# Patient Record
Sex: Male | Born: 1960 | Race: White | Hispanic: No | Marital: Married | Smoking: Never smoker
Health system: Southern US, Community
[De-identification: ages and names within clinical notes are randomized; demographics above are authoritative.]

## PROBLEM LIST (undated history)

## (undated) DIAGNOSIS — Z8249 Family history of ischemic heart disease and other diseases of the circulatory system: Secondary | ICD-10-CM

## (undated) DIAGNOSIS — Z8279 Family history of other congenital malformations, deformations and chromosomal abnormalities: Secondary | ICD-10-CM

## (undated) DIAGNOSIS — F329 Major depressive disorder, single episode, unspecified: Secondary | ICD-10-CM

## (undated) HISTORY — PX: OTHER SURGICAL HISTORY: SHX169

## (undated) HISTORY — DX: Family history of ischemic heart disease and other diseases of the circulatory system: Z82.49

## (undated) HISTORY — DX: Major depressive disorder, single episode, unspecified: F32.9

## (undated) HISTORY — DX: Family history of other congenital malformations, deformations and chromosomal abnormalities: Z82.79

---
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
## 2004-12-18 ENCOUNTER — Ambulatory Visit: Payer: Self-pay | Admitting: Internal Medicine

## 2004-12-19 ENCOUNTER — Ambulatory Visit: Payer: Self-pay | Admitting: Internal Medicine

## 2006-06-01 ENCOUNTER — Ambulatory Visit: Payer: Self-pay | Admitting: Internal Medicine

## 2006-08-12 ENCOUNTER — Ambulatory Visit: Payer: Self-pay | Admitting: Internal Medicine

## 2007-09-06 ENCOUNTER — Encounter: Admission: RE | Admit: 2007-09-06 | Discharge: 2007-09-06 | Payer: Self-pay | Admitting: Orthopedic Surgery

## 2010-11-14 ENCOUNTER — Ambulatory Visit: Payer: Self-pay | Admitting: Internal Medicine

## 2010-11-14 LAB — CONVERTED CEMR LAB
ALT: 30 units/L (ref 0–53)
AST: 20 units/L (ref 0–37)
Albumin: 4.1 g/dL (ref 3.5–5.2)
Alkaline Phosphatase: 52 units/L (ref 39–117)
Basophils Relative: 0.3 % (ref 0.0–3.0)
Bilirubin, Direct: 0.1 mg/dL (ref 0.0–0.3)
CO2: 28 meq/L (ref 19–32)
Calcium: 9 mg/dL (ref 8.4–10.5)
Chloride: 105 meq/L (ref 96–112)
Eosinophils Relative: 2 % (ref 0.0–5.0)
Glucose, Bld: 80 mg/dL (ref 70–99)
HCT: 41.8 % (ref 39.0–52.0)
LDL Cholesterol: 92 mg/dL (ref 0–99)
Lymphs Abs: 1.2 10*3/uL (ref 0.7–4.0)
MCHC: 34.7 g/dL (ref 30.0–36.0)
MCV: 91.3 fL (ref 78.0–100.0)
Monocytes Relative: 6.8 % (ref 3.0–12.0)
Nitrite: NEGATIVE
Platelets: 168 10*3/uL (ref 150.0–400.0)
Protein, U semiquant: NEGATIVE
RBC: 4.58 M/uL (ref 4.22–5.81)
Sodium: 141 meq/L (ref 135–145)
Specific Gravity, Urine: 1.01
Total CHOL/HDL Ratio: 3
VLDL: 5.8 mg/dL (ref 0.0–40.0)
pH: 6.5

## 2010-11-24 DIAGNOSIS — F32A Depression, unspecified: Secondary | ICD-10-CM

## 2010-11-24 DIAGNOSIS — F329 Major depressive disorder, single episode, unspecified: Secondary | ICD-10-CM | POA: Insufficient documentation

## 2010-11-24 HISTORY — DX: Depression, unspecified: F32.A

## 2010-11-29 ENCOUNTER — Encounter: Payer: Self-pay | Admitting: Internal Medicine

## 2010-11-29 ENCOUNTER — Ambulatory Visit
Admission: RE | Admit: 2010-11-29 | Discharge: 2010-11-29 | Payer: Self-pay | Source: Home / Self Care | Attending: Internal Medicine | Admitting: Internal Medicine

## 2010-11-29 DIAGNOSIS — F329 Major depressive disorder, single episode, unspecified: Secondary | ICD-10-CM | POA: Insufficient documentation

## 2010-12-26 NOTE — Assessment & Plan Note (Signed)
Summary: cpx/njr   Vital Signs:  Patient profile:   50 year old male Height:      69.5 inches Weight:      181 pounds BMI:     26.44 Temp:     98.5 degrees F oral Pulse rate:   68 / minute Pulse rhythm:   regular BP sitting:   120 / 82  (left arm) Cuff size:   regular  Vitals Entered By: Alfred Levins, CMA (November 29, 2010 10:26 AM)  Nutrition Counseling: Patient's BMI is greater than 25 and therefore counseled on weight management options. CC: cpx   CC:  cpx.  History of Present Illness: CPX  new problem: mood swings: he admits to depression, agitation, anger has trouble with early and frequent awakenings ("i think about everything").  no change in work, family..."just my mood" All other systems reviewed and were negative except says 6 months ago "pulled groin on left side"---still with some discomfort---mostly noticeable at night---no daytie symptoms  Current Medications (verified): 1)  None  Allergies (verified): No Known Drug Allergies  Past History:  Family History: Last updated: 11/29/2010 Family History of Arthritis Paternal Grandmother and father Family History High cholesterol Father Family History Hypertension Father and mother (both alive) 3 brothers healthy  Social History: Last updated: 11/29/2010 Occupation: Married Never Smoked Alcohol use-yes once weekly Drug use-no Regular exercise-no  Risk Factors: Exercise: no (07/20/2007)  Risk Factors: Smoking Status: never (07/20/2007)  Past Medical History:  Depression---new dx 2012  Past Surgical History: Rt Hand Surgery  Family History: Family History of Arthritis Paternal Grandmother and father Family History High cholesterol Father Family History Hypertension Father and mother (both alive) 3 brothers healthy  Social History: Occupation: Married Never Smoked Alcohol use-yes once weekly Drug use-no Regular exercise-no  Physical Exam  General:   healthy-appearing male in no  acute distress. HEENT exam atraumatic, normocephalic, Dr. muscles are intact. Neck is supple without lymphadenopathy or thyromegaly. No jugular venous distention. Chest clear to auscultation without increased work of breathing. Abdomen percussion. Cardiac exam S1-S2 are regular abdominal cyanosis, soft and nontender extremities with no clubbing cyanosis or edema. he has full range of motion of both hips. Affect normal---perhaps decreased eye contact Ears:  R ear normal.     Impression & Recommendations:  Problem # 1:  PREVENTIVE HEALTH CARE (ICD-V70.0) discussed moderate weight loss  Problem # 2:  DEPRESSION (ICD-311)  discussed at length will try medications. Side effects discussed. I suspect his depression is affecting his sleep.  His updated medication list for this problem includes:    Sertraline Hcl 50 Mg Tabs (Sertraline hcl) .Marland Kitchen... 1 by mouth daily  Complete Medication List: 1)  Sertraline Hcl 50 Mg Tabs (Sertraline hcl) .Marland Kitchen.. 1 by mouth daily  Patient Instructions: 1)  see me 6 weeks Prescriptions: SERTRALINE HCL 50 MG TABS (SERTRALINE HCL) 1 by mouth daily  #90 x 3   Entered and Authorized by:   Birdie Sons MD   Signed by:   Birdie Sons MD on 11/29/2010   Method used:   Electronically to        [facility] [phone][facility] (retail)       [address] [place], [place]  [address]       Ph: [phone] or [phone]       Fax: [phone]   RxID:   [id number]    Orders Added: 1)  New  Patient 40-64 years [99386] 2)  Est. Patient Level II [16109]   Immunization History:  Tetanus/Td Immunization History:    Tetanus/Td:  historical (11/24/2004)    Immunization History:  Tetanus/Td Immunization History:    Tetanus/Td:  Historical (11/24/2004)

## 2011-01-02 ENCOUNTER — Encounter: Payer: Self-pay | Admitting: Internal Medicine

## 2011-01-03 ENCOUNTER — Ambulatory Visit (INDEPENDENT_AMBULATORY_CARE_PROVIDER_SITE_OTHER): Payer: BC Managed Care – PPO | Admitting: Internal Medicine

## 2011-01-03 ENCOUNTER — Encounter: Payer: Self-pay | Admitting: Internal Medicine

## 2011-01-03 DIAGNOSIS — F329 Major depressive disorder, single episode, unspecified: Secondary | ICD-10-CM

## 2011-01-03 NOTE — Assessment & Plan Note (Addendum)
Feels much better Wife states that he is much better.   15 minute discussion all counselling.  ADVISE CONTINUATION OF THE SERTRALINE. I'LL SEE HIM BACK IN 6 MONTHS.

## 2011-01-03 NOTE — Progress Notes (Signed)
  Subjective:    Patient ID: Tony Buck, male    DOB: Apr 27, 1961, 50 y.o.   MRN: [id number]  HPI   depression. See assessment and plan.  Review of Systems     Objective:   Physical Exam        Assessment & Plan:

## 2011-07-02 ENCOUNTER — Ambulatory Visit (INDEPENDENT_AMBULATORY_CARE_PROVIDER_SITE_OTHER): Payer: BC Managed Care – PPO | Admitting: Internal Medicine

## 2011-07-02 ENCOUNTER — Encounter: Payer: Self-pay | Admitting: Internal Medicine

## 2011-07-02 VITALS — BP 116/80 | HR 64 | Temp 98.3°F | Ht 70.0 in | Wt 179.0 lb

## 2011-07-02 DIAGNOSIS — F329 Major depressive disorder, single episode, unspecified: Secondary | ICD-10-CM

## 2011-07-02 MED ORDER — SERTRALINE HCL 50 MG PO TABS: 50.0000 mg | ORAL_TABLET | Freq: Every day | ORAL | Status: DC

## 2011-07-02 NOTE — Progress Notes (Signed)
  Subjective:    Patient ID: Tony Buck, male    DOB: 1961-03-25, 50 y.o.   MRN: [id number]  HPI Mood disorder- he continues to do well No significant side effects Wife states that medication is working.  Past Medical History  Diagnosis Date  . Depression 2012   Past Surgical History  Procedure Date  . Hand surgery 1975  . Hand     Right hand surgery    reports that he has never smoked. He does not have any smokeless tobacco history on file. He reports that he drinks alcohol. He reports that he does not use illicit drugs. family history includes Arthritis in his father and paternal grandmother; Hyperlipidemia in his father; and Hypertension in his father and mother. No Known Allergies    Review of Systems  patient denies chest pain, shortness of breath, orthopnea. Denies lower extremity edema, abdominal pain, change in appetite, change in bowel movements. Patient denies rashes, musculoskeletal complaints. No other specific complaints in a complete review of systems.      Objective:   Physical Exam   well-developed well-nourished male in no acute distress. HEENT exam atraumatic, normocephalic, neck supple without jugular venous distention. affect normal   Assessment & Plan:

## 2011-07-02 NOTE — Assessment & Plan Note (Signed)
sxs seem to  Be controlled Sleep is improved, mood is improved Continue meds

## 2011-07-09 ENCOUNTER — Telehealth: Payer: Self-pay | Admitting: Internal Medicine

## 2011-07-09 NOTE — Telephone Encounter (Signed)
Pt. Notified.

## 2011-07-09 NOTE — Telephone Encounter (Signed)
Have him decrease to 1/2 pill daily for 7 days and then 1/2 pill every other day for 7 days and then stop

## 2011-07-09 NOTE — Telephone Encounter (Signed)
Patient wants to wean off Zoloft. Please advise. Thanks.

## 2011-11-06 ENCOUNTER — Ambulatory Visit (INDEPENDENT_AMBULATORY_CARE_PROVIDER_SITE_OTHER): Payer: BC Managed Care – PPO | Admitting: Internal Medicine

## 2011-11-06 ENCOUNTER — Encounter: Payer: Self-pay | Admitting: Internal Medicine

## 2011-11-06 VITALS — BP 112/84 | HR 76 | Temp 98.2°F | Ht 71.0 in | Wt 186.0 lb

## 2011-11-06 DIAGNOSIS — F39 Unspecified mood [affective] disorder: Secondary | ICD-10-CM

## 2011-11-06 NOTE — Progress Notes (Signed)
Patient ID: Tony Buck, male   DOB: 03/06/1961, 50 y.o.   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  Past Surgical History  Procedure Date  . Hand surgery 1975  . Hand     Right hand surgery    reports that he has never smoked. He does not have any smokeless tobacco history on file. He reports that he drinks alcohol. He reports that he does not use illicit drugs. family history includes Arthritis in his father and paternal grandmother; Hyperlipidemia in his father; and Hypertension in his father and mother. No Known Allergies  Exam: Normal affect, appears well.  A/P Mood: well controlled off meds. 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

## 2012-01-02 ENCOUNTER — Ambulatory Visit: Payer: BC Managed Care – PPO | Admitting: Internal Medicine

## 2012-08-11 ENCOUNTER — Ambulatory Visit (INDEPENDENT_AMBULATORY_CARE_PROVIDER_SITE_OTHER): Payer: BC Managed Care – PPO | Admitting: Family Medicine

## 2012-08-11 ENCOUNTER — Encounter: Payer: Self-pay | Admitting: Family Medicine

## 2012-08-11 ENCOUNTER — Telehealth: Payer: Self-pay | Admitting: Internal Medicine

## 2012-08-11 VITALS — BP 130/80 | Temp 98.0°F | Wt 184.0 lb

## 2012-08-11 DIAGNOSIS — H538 Other visual disturbances: Secondary | ICD-10-CM

## 2012-08-11 DIAGNOSIS — R079 Chest pain, unspecified: Secondary | ICD-10-CM

## 2012-08-11 DIAGNOSIS — R51 Headache: Secondary | ICD-10-CM

## 2012-08-11 NOTE — Progress Notes (Signed)
Subjective:    Patient ID: Tony Buck, male    DOB: 11-07-1961, 51 y.o.   MRN: [id number]  HPI  Patient presents with several different symptoms as follows:  6 week history of headache. Relatively low grade 4/10 severity and achy quality, bilateral occipital. Symptoms tend to be worse early in the morning and usually improved following aspirin. Recent increased job stress. Poor quality of sleep. Reducing caffeine use gradually.  Earlier today at work patient had episode about 30 minutes duration of bilateral blurred vision. This was not just peripheral vision but entire visual field. He had some vision but blurring. No worsening of headache during this episode. He did have some very transient vertigo. No recent hearing changes. No history of migraine. No diplopia. No dysarthria. No focal weakness. No ataxia. No nausea or vomiting. Denies recent appetite or weight changes. No cognitive changes.  Patient relates very mild chest pain at rest currently. No recent exertional symptoms. No left arm symptoms. No diaphoresis.  He has difficulty describing quality. No significant past history. Nonsmoker. No history of diabetes. No GERD symptoms.  Past Medical History  Diagnosis Date  . Depression 2012   Past Surgical History  Procedure Date  . Hand surgery 1975  . Hand     Right hand surgery    reports that he has never smoked. He does not have any smokeless tobacco history on file. He reports that he drinks alcohol. He reports that he does not use illicit drugs. family history includes Arthritis in his father and paternal grandmother; Hyperlipidemia in his father; and Hypertension in his father and mother. No Known Allergies    Review of Systems  Constitutional: Negative for fever, chills, appetite change and unexpected weight change.  Eyes: Positive for visual disturbance.  Respiratory: Negative for shortness of breath and wheezing.   Cardiovascular: Positive for chest pain. Negative  for palpitations and leg swelling.  Gastrointestinal: Negative for abdominal pain.  Skin: Negative for rash.  Neurological: Positive for dizziness and headaches. Negative for seizures, syncope and weakness.  Hematological: Negative for adenopathy.  Psychiatric/Behavioral: Negative for confusion.       Objective:   Physical Exam  Constitutional: He is oriented to person, place, and time. He appears well-developed and well-nourished.  HENT:  Head: Normocephalic and atraumatic.  Right Ear: External ear normal.  Left Ear: External ear normal.  Eyes: Pupils are equal, round, and reactive to light.  Neck: Neck supple. No thyromegaly present.       No carotid bruits  Cardiovascular: Normal rate and regular rhythm.   No murmur heard. Pulmonary/Chest: Effort normal and breath sounds normal. No respiratory distress. He has no wheezes. He has no rales.  Musculoskeletal: He exhibits no edema.  Lymphadenopathy:    He has no cervical adenopathy.  Neurological: He is alert and oriented to person, place, and time. He has normal reflexes. No cranial nerve deficit. Coordination normal.       Normal cerebellar function. No focal strength deficits. Gait normal. Babinski's downgoing bilaterally. Romberg normal.  Skin: No rash noted.  Psychiatric: He has a normal mood and affect. His behavior is normal.          Assessment & Plan:  #1 chronic occipital headaches. Question chronic tension type. Our concern though is that he had episode lasting 30 minutes earlier today of bilateral blurred vision and also new symptom of vertigo which was very transient which may or may not be related. No worrisome features such as appetite or weight  changes.. Bilateral visual symptoms do not suggest TIA.  Set up CT head to further evaluate #2 atypical chest pain. Symptoms relatively mild. Doubt related to #1 check EKG  EKG sinus bradycardia (52) with no acute changes.

## 2012-08-11 NOTE — Telephone Encounter (Signed)
Patient calling for an appointment.  Has had alot headaches in the past 4-6 weeks.  Has had some dizziness for the past few days.  Denies any trauma.  He has been taking ASA to relieve the pain.  Had worsening dizziness this am for a short period of time.  Strongly urged him to have someone drive him to the appt.   RN override to have him seen in 4 hours.  Scheduled for 11a today.

## 2012-08-11 NOTE — Patient Instructions (Addendum)
Follow up immediately for any recurrent blurred vision, worsening headache, or any focal weakness,or other neurologic symptoms.

## 2012-08-13 ENCOUNTER — Other Ambulatory Visit: Payer: BC Managed Care – PPO

## 2012-08-17 ENCOUNTER — Ambulatory Visit (INDEPENDENT_AMBULATORY_CARE_PROVIDER_SITE_OTHER)
Admission: RE | Admit: 2012-08-17 | Discharge: 2012-08-17 | Disposition: A | Discharge status: Home / Self Care | Payer: BC Managed Care – PPO | Source: Ambulatory Visit | Attending: Family Medicine | Admitting: Family Medicine

## 2012-08-17 DIAGNOSIS — H538 Other visual disturbances: Secondary | ICD-10-CM

## 2012-08-17 DIAGNOSIS — R51 Headache: Secondary | ICD-10-CM

## 2012-08-18 NOTE — Progress Notes (Signed)
Quick Note:  Pt informed on personally identified VM ______ 

## 2012-09-24 ENCOUNTER — Other Ambulatory Visit (INDEPENDENT_AMBULATORY_CARE_PROVIDER_SITE_OTHER): Payer: BC Managed Care – PPO

## 2012-09-24 DIAGNOSIS — Z0000 Encounter for general adult medical examination without abnormal findings: Secondary | ICD-10-CM

## 2012-09-24 LAB — CBC WITH DIFFERENTIAL/PLATELET
Basophils Relative: 0.6 % (ref 0.0–3.0)
Lymphocytes Relative: 33.4 % (ref 12.0–46.0)
Lymphs Abs: 1.6 10*3/uL (ref 0.7–4.0)
Monocytes Absolute: 0.4 10*3/uL (ref 0.1–1.0)
Neutrophils Relative %: 55.4 % (ref 43.0–77.0)
Platelets: 157 10*3/uL (ref 150.0–400.0)
RBC: 4.7 Mil/uL (ref 4.22–5.81)

## 2012-09-24 LAB — HEPATIC FUNCTION PANEL
Albumin: 4 g/dL (ref 3.5–5.2)
Alkaline Phosphatase: 54 U/L (ref 39–117)
Total Bilirubin: 0.6 mg/dL (ref 0.3–1.2)
Total Protein: 6.7 g/dL (ref 6.0–8.3)

## 2012-09-24 LAB — LIPID PANEL
HDL: 46.1 mg/dL (ref >39.00)
LDL Cholesterol: 97 mg/dL (ref 0–99)
VLDL: 8.2 mg/dL (ref 0.0–40.0)

## 2012-09-24 LAB — POCT URINALYSIS DIPSTICK
Ketones, UA: NEGATIVE
Leukocytes, UA: NEGATIVE
Protein, UA: NEGATIVE
Urobilinogen, UA: 0.2

## 2012-09-24 LAB — BASIC METABOLIC PANEL
BUN: 18 mg/dL (ref 6–23)
CO2: 26 mEq/L (ref 19–32)
Calcium: 9 mg/dL (ref 8.4–10.5)
Chloride: 104 mEq/L (ref 96–112)
Creatinine, Ser: 1 mg/dL (ref 0.4–1.5)
Glucose, Bld: 86 mg/dL (ref 70–99)
Potassium: 4.5 mEq/L (ref 3.5–5.1)

## 2012-09-24 LAB — TSH: TSH: 1.26 u[IU]/mL (ref 0.35–5.50)

## 2012-09-24 LAB — PSA: PSA: 0.63 ng/mL (ref 0.10–4.00)

## 2012-10-11 ENCOUNTER — Ambulatory Visit (INDEPENDENT_AMBULATORY_CARE_PROVIDER_SITE_OTHER): Payer: BC Managed Care – PPO | Admitting: Internal Medicine

## 2012-10-11 ENCOUNTER — Encounter: Payer: Self-pay | Admitting: Internal Medicine

## 2012-10-11 VITALS — BP 118/88 | HR 68 | Temp 97.9°F | Ht 71.0 in | Wt 186.0 lb

## 2012-10-11 DIAGNOSIS — Z23 Encounter for immunization: Secondary | ICD-10-CM

## 2012-10-11 DIAGNOSIS — Z0000 Encounter for general adult medical examination without abnormal findings: Secondary | ICD-10-CM

## 2012-10-11 NOTE — Progress Notes (Signed)
Patient ID: Tony Buck, male   DOB: July 25, 1961, 51 y.o.   MRN: [id number]  CPX  Past Medical History  Diagnosis Date  . Depression 2012    History   Social History  . Marital Status: Married    Spouse Name: N/A    Number of Children: N/A  . Years of Education: N/A   Occupational History  . Not on file.   Social History Main Topics  . Smoking status: Never Smoker   . Smokeless tobacco: Not on file  . Alcohol Use: Yes  . Drug Use: No  . Sexually Active:    Other Topics Concern  . Not on file   Social History Narrative  . No narrative on file    Past Surgical History  Procedure Date  . Hand surgery 1975  . Hand     Right hand surgery    Family History  Problem Relation Age of Onset  . Hypertension Mother   . Hyperlipidemia Father   . Hypertension Father   . Arthritis Father   . Arthritis Paternal Grandmother     No Known Allergies  No current outpatient prescriptions on file prior to visit.     patient denies chest pain, shortness of breath, orthopnea. Denies lower extremity edema, abdominal pain, change in appetite, change in bowel movements. Patient denies rashes, musculoskeletal complaints. No other specific complaints in a complete review of systems.   BP 118/88  Pulse 68  Temp 97.9 F (36.6 C) (Oral)  Ht 5\' 11"  (1.803 m)  Wt 186 lb (84.369 kg)  BMI 25.94 kg/m2  well-developed well-nourished male in no acute distress. HEENT exam atraumatic, normocephalic, neck supple without jugular venous distention. Chest clear to auscultation cardiac exam S1-S2 are regular. Abdominal exam overweight with bowel sounds, soft and nontender. Extremities no edema. Neurologic exam is alert with a normal gait.   A/P- well visit- health maint UTD.

## 2013-02-18 ENCOUNTER — Encounter: Payer: Self-pay | Admitting: Internal Medicine

## 2013-03-28 ENCOUNTER — Encounter: Payer: Self-pay | Admitting: Internal Medicine

## 2013-03-28 ENCOUNTER — Ambulatory Visit (AMBULATORY_SURGERY_CENTER): Payer: BC Managed Care – PPO | Admitting: *Deleted

## 2013-03-28 VITALS — Ht 71.0 in | Wt 186.0 lb

## 2013-03-28 DIAGNOSIS — Z1211 Encounter for screening for malignant neoplasm of colon: Secondary | ICD-10-CM

## 2013-03-28 MED ORDER — NA SULFATE-K SULFATE-MG SULF 17.5-3.13-1.6 GM/177ML PO SOLN: ORAL | Status: DC

## 2013-03-28 NOTE — Progress Notes (Signed)
No egg or soy allergy  Emmi information given and pt registered

## 2013-04-08 ENCOUNTER — Encounter: Payer: Self-pay | Admitting: Internal Medicine

## 2013-04-08 ENCOUNTER — Ambulatory Visit (AMBULATORY_SURGERY_CENTER): Payer: BC Managed Care – PPO | Admitting: Internal Medicine

## 2013-04-08 VITALS — BP 122/69 | HR 51 | Temp 96.9°F | Resp 29 | Ht 71.0 in | Wt 186.0 lb

## 2013-04-08 DIAGNOSIS — D126 Benign neoplasm of colon, unspecified: Secondary | ICD-10-CM

## 2013-04-08 DIAGNOSIS — K5730 Diverticulosis of large intestine without perforation or abscess without bleeding: Secondary | ICD-10-CM

## 2013-04-08 DIAGNOSIS — Z1211 Encounter for screening for malignant neoplasm of colon: Secondary | ICD-10-CM

## 2013-04-08 MED ORDER — SODIUM CHLORIDE 0.9 % IV SOLN: 500.0000 mL | INTRAVENOUS | Status: DC

## 2013-04-08 NOTE — Patient Instructions (Addendum)
YOU HAD AN ENDOSCOPIC PROCEDURE TODAY AT THE [facility]: Refer to the procedure report that was given to you for any specific questions about what was found during the examination.  If the procedure report does not answer your questions, please call your gastroenterologist to clarify.  If you requested that your care partner not be given the details of your procedure findings, then the procedure report has been included in a sealed envelope for you to review at your convenience later.  YOU SHOULD EXPECT: Some feelings of bloating in the abdomen. Passage of more gas than usual.  Walking can help get rid of the air that was put into your GI tract during the procedure and reduce the bloating. If you had a lower endoscopy (such as a colonoscopy or flexible sigmoidoscopy) you may notice spotting of blood in your stool or on the toilet paper. If you underwent a bowel prep for your procedure, then you may not have a normal bowel movement for a few days.  DIET: Your first meal following the procedure should be a light meal and then it is ok to progress to your normal diet.  A half-sandwich or bowl of soup is an example of a good first meal.  Heavy or fried foods are harder to digest and may make you feel nauseous or bloated.  Likewise meals heavy in dairy and vegetables can cause extra gas to form and this can also increase the bloating.  Drink plenty of fluids but you should avoid alcoholic beverages for 24 hours.  ACTIVITY: Your care partner should take you home directly after the procedure.  You should plan to take it easy, moving slowly for the rest of the day.  You can resume normal activity the day after the procedure however you should NOT DRIVE or use heavy machinery for 24 hours (because of the sedation medicines used during the test).    SYMPTOMS TO REPORT IMMEDIATELY: A gastroenterologist can be reached at any hour.  During normal business hours, 8:30 AM to 5:00 PM Monday through Friday,  call [phone].  After hours and on weekends, please call the GI answering service at [phone] who will take a message and have the physician on call contact you.   Following lower endoscopy (colonoscopy or flexible sigmoidoscopy):  Excessive amounts of blood in the stool  Significant tenderness or worsening of abdominal pains  Swelling of the abdomen that is new, acute  Fever of 100F or higher    FOLLOW UP: If any biopsies were taken you will be contacted by phone or by letter within the next 1-3 weeks.  Call your gastroenterologist if you have not heard about the biopsies in 3 weeks.  Our staff will call the home number listed on your records the next business day following your procedure to check on you and address any questions or concerns that you may have at that time regarding the information given to you following your procedure. This is a courtesy call and so if there is no answer at the home number and we have not heard from you through the emergency physician on call, we will assume that you have returned to your regular daily activities without incident.  SIGNATURES/CONFIDENTIALITY: You and/or your care partner have signed paperwork which will be entered into your electronic medical record.  These signatures attest to the fact that that the information above on your After Visit Summary has been reviewed and is understood.  Full responsibility of the confidentiality   of this discharge information lies with you and/or your care-partner.  Polyp and diverticulosis information given. 

## 2013-04-08 NOTE — Op Note (Signed)
[facility] [address] [place], [address]   COLONOSCOPY PROCEDURE REPORT  PATIENT: Tony Buck, Tony Buck  MR#: [id number] BIRTHDATE: 11/01/1961 , 51  yrs. old GENDER: Male ENDOSCOPIST: Iva Boop, MD, [facility] REFERRED WU:[id number] Swords, M.D. PROCEDURE DATE:  04/08/2013 PROCEDURE:   Colonoscopy, screening ASA CLASS:   Class I INDICATIONS:average risk screening. MEDICATIONS: propofol (Diprivan) 300mg  IV, MAC sedation, administered by CRNA, and These medications were titrated to patient response per physician's verbal order  DESCRIPTION OF PROCEDURE:   After the risks benefits and alternatives of the procedure were thoroughly explained, informed consent was obtained.  A digital rectal exam revealed no abnormalities of the rectum, A digital rectal exam revealed no prostatic nodules, and A digital rectal exam revealed the prostate was not enlarged.   The [id number]  endoscope was introduced through the anus and advanced to the cecum, which was identified by both the appendix and ileocecal valve. No adverse events experienced.   The quality of the prep was excellent using Suprep  The instrument was then slowly withdrawn as the colon was fully examined.      COLON FINDINGS: A sessile polyp measuring 5 mm in size was found in the transverse colon.  A polypectomy was performed with a cold snare.  The resection was complete and the polyp tissue was completely retrieved.   Moderate diverticulosis was noted The finding was in the left colon.   Mild diverticulosis was noted The finding was in the right colon.   The colon mucosa was otherwise normal.   A right colon retroflexion was performed.  Retroflexed views revealed no abnormalities. The time to cecum=3 minutes 01 seconds.  Withdrawal time=11 minutes 34 seconds.  The scope was withdrawn and the procedure completed. COMPLICATIONS: There were no complications.  ENDOSCOPIC IMPRESSION: 1.   Sessile polyp  measuring 5 mm in size was found in the transverse colon; polypectomy was performed with a cold snare 2.   Moderate diverticulosis was noted in the left colon 3.   Mild diverticulosis was noted in the right colon 4.   The colon mucosa was otherwise normal - excellent prep  RECOMMENDATIONS: Timing of repeat colonoscopy will be determined by pathology findings.   eSigned:  Iva Boop, MD, [facility] 04/08/2013 12:30 PM   cc: Lindley Magnus, MD and The Patient

## 2013-04-08 NOTE — Progress Notes (Signed)
Called to room to assist during endoscopic procedure.  Patient ID and intended procedure confirmed with present staff. Received instructions for my participation in the procedure from the performing physician.Called to room to assist during endoscopic procedure.  Patient ID and intended procedure confirmed with present staff. Received instructions for my participation in the procedure from the performing physician. 

## 2013-04-08 NOTE — Progress Notes (Signed)
Patient did not experience any of the following events: a burn prior to discharge; a fall within the facility; wrong site/side/patient/procedure/implant event; or a hospital transfer or hospital admission upon discharge from the facility. (G8907) Patient did not have preoperative order for IV antibiotic SSI prophylaxis. (G8918)  

## 2013-04-11 ENCOUNTER — Telehealth: Payer: Self-pay | Admitting: *Deleted

## 2013-04-11 NOTE — Telephone Encounter (Signed)
No answer, message left for the patient. 

## 2013-04-14 ENCOUNTER — Encounter: Payer: Self-pay | Admitting: Internal Medicine

## 2013-04-14 NOTE — Progress Notes (Signed)
Quick Note:  Diminutive hyperplastic polyp Repeat colon 10 yrs - 2024 ______

## 2015-02-05 ENCOUNTER — Encounter: Payer: Self-pay | Admitting: Family Medicine

## 2015-02-05 ENCOUNTER — Ambulatory Visit (INDEPENDENT_AMBULATORY_CARE_PROVIDER_SITE_OTHER): Payer: 59 | Admitting: Family Medicine

## 2015-02-05 VITALS — BP 130/90 | HR 70 | Temp 98.2°F | Ht 71.0 in | Wt 190.1 lb

## 2015-02-05 DIAGNOSIS — R030 Elevated blood-pressure reading, without diagnosis of hypertension: Secondary | ICD-10-CM

## 2015-02-05 DIAGNOSIS — E663 Overweight: Secondary | ICD-10-CM

## 2015-02-05 DIAGNOSIS — J019 Acute sinusitis, unspecified: Secondary | ICD-10-CM

## 2015-02-05 DIAGNOSIS — IMO0001 Reserved for inherently not codable concepts without codable children: Secondary | ICD-10-CM

## 2015-02-05 DIAGNOSIS — R631 Polydipsia: Secondary | ICD-10-CM | POA: Diagnosis not present

## 2015-02-05 LAB — LIPID PANEL
Cholesterol: 162 mg/dL (ref 0–200)
HDL: 55.1 mg/dL (ref >39.00)
LDL Cholesterol: 76 mg/dL (ref 0–99)
NONHDL: 106.9
TRIGLYCERIDES: 156 mg/dL — AB (ref 0.0–149.0)
Total CHOL/HDL Ratio: 3
VLDL: 31.2 mg/dL (ref 0.0–40.0)

## 2015-02-05 LAB — HEMOGLOBIN A1C: HEMOGLOBIN A1C: 5.3 % (ref 4.6–6.5)

## 2015-02-05 MED ORDER — AMOXICILLIN-POT CLAVULANATE 875-125 MG PO TABS: 1.0000 | ORAL_TABLET | Freq: Two times a day (BID) | ORAL | Status: DC

## 2015-02-05 NOTE — Progress Notes (Signed)
Pre visit review using our clinic review tool, if applicable. No additional management support is needed unless otherwise documented below in the visit note. 

## 2015-02-05 NOTE — Addendum Note (Signed)
Addended by: Lucretia Kern on: 02/05/2015 08:26 AM   Modules accepted: Orders

## 2015-02-05 NOTE — Patient Instructions (Signed)
BEFORE YOU LEAVE: -scheduled New Patient Visit -labs  -We have ordered labs or studies at this visit. It can take up to 1-2 weeks for results and processing. We will contact you with instructions IF your results are abnormal. Normal results will be released to your [facility]. If you have not heard from [place] or can not find your results in [facility] in 2 weeks please contact our office.  -Take the antibiotic as advised. See the Ear, Nose and Throat doctor if symptoms persist.  We recommend the following healthy lifestyle measures: - eat a healthy diet consisting of lots of vegetables, fruits, beans, nuts, seeds, healthy meats such as white chicken and fish and whole grains.  - avoid fried foods, fast food, processed foods, sodas, red meet and other fattening foods.  - get a least 150 minutes of aerobic exercise per week.

## 2015-02-05 NOTE — Progress Notes (Signed)
HPI:  Tony Buck is a 54 yo pior patient of Dr. Leanne Chang with no PCP currently, last seen here in 2013, here for an acute visit for:   "?sinus infection" -started 3-4 weeks ago -sinus congestion, PND, sensitive teeth, dry mouth sometimes - now with sinus pressure and pain in teeth -denies: fevers, chills, SOB, dysphagia, NVD -hx of sinus infections -wants his basic labs as has gained weight -hx of sinus infection    ROS: See pertinent positives and negatives per HPI.  Past Medical History  Diagnosis Date  . Depression 2012    Past Surgical History  Procedure Laterality Date  . Hand surgery  1975  . Hand      Right hand surgery    Family History  Problem Relation Age of Onset  . Hypertension Mother   . Hyperlipidemia Father   . Hypertension Father   . Arthritis Father   . Arthritis Paternal Grandmother     History   Social History  . Marital Status: Married    Spouse Name: N/A  . Number of Children: N/A  . Years of Education: N/A   Social History Main Topics  . Smoking status: Never Smoker   . Smokeless tobacco: Never Used  . Alcohol Use: Yes     Comment: 3 beers a week  . Drug Use: No  . Sexual Activity: Not on file   Other Topics Concern  . None   Social History Narrative    No current outpatient prescriptions on file.  EXAM:  Filed Vitals:   02/05/15 0804  BP: 130/90  Pulse: 70  Temp: 98.2 F (36.8 C)    Body mass index is 26.53 kg/(m^2).  GENERAL: vitals reviewed and listed above, alert, oriented, appears well hydrated and in no acute distress  HEENT: atraumatic, conjunttiva clear, no obvious abnormalities on inspection of external nose and ears, normal appearance of ear canals and TMs except for bilat effusion - purulent on R, thick nasal congestion, mild post oropharyngeal erythema with PND, no tonsillar edema or exudate, no sinus TTP  NECK: bilat upper ant cervical LAD  - mobile  LUNGS: clear to auscultation bilaterally, no  wheezes, rales or rhonchi, good air movement  CV: HRRR, no peripheral edema  MS: moves all extremities without noticeable abnormality  PSYCH: pleasant and cooperative, no obvious depression or anxiety  ASSESSMENT AND PLAN:  Discussed the following assessment and plan:  Acute sinusitis, recurrence not specified, unspecified location -tx with augmentin, risk discussed, advised ENT if LAD persists and number provided to call  Increased thirst - Plan: Hemoglobin A1c  Overweight - Plan: Lipid panel  Elevated BP -recheck at establish care visit  Advised to establish care with new PCP in next 2-3 month for follow up and new pt visit. Advised CPE not done at new pt visit.s  -Patient advised to return or notify a doctor immediately if symptoms worsen or persist or new concerns arise.  Patient Instructions  BEFORE YOU LEAVE: -scheduled New Patient Visit -labs  -We have ordered labs or studies at this visit. It can take up to 1-2 weeks for results and processing. We will contact you with instructions IF your results are abnormal. Normal results will be released to your [facility]. If you have not heard from [place] or can not find your results in [facility] in 2 weeks please contact our office.  -Take the antibiotic as advised. See the Ear, Nose and Throat doctor if symptoms persist.  We recommend the following healthy lifestyle  measures: - eat a healthy diet consisting of lots of vegetables, fruits, beans, nuts, seeds, healthy meats such as white chicken and fish and whole grains.  - avoid fried foods, fast food, processed foods, sodas, red meet and other fattening foods.  - get a least 150 minutes of aerobic exercise per week.            Colin Benton R.

## 2015-05-07 ENCOUNTER — Ambulatory Visit: Payer: 59 | Admitting: Family Medicine

## 2016-12-17 DIAGNOSIS — L570 Actinic keratosis: Secondary | ICD-10-CM | POA: Diagnosis not present

## 2017-03-31 DIAGNOSIS — L578 Other skin changes due to chronic exposure to nonionizing radiation: Secondary | ICD-10-CM | POA: Diagnosis not present

## 2017-03-31 DIAGNOSIS — D2230 Melanocytic nevi of unspecified part of face: Secondary | ICD-10-CM | POA: Diagnosis not present

## 2017-03-31 DIAGNOSIS — D225 Melanocytic nevi of trunk: Secondary | ICD-10-CM | POA: Diagnosis not present

## 2018-02-18 ENCOUNTER — Ambulatory Visit: Payer: 59 | Admitting: Family Medicine

## 2018-04-13 DIAGNOSIS — D235 Other benign neoplasm of skin of trunk: Secondary | ICD-10-CM | POA: Diagnosis not present

## 2018-04-13 DIAGNOSIS — L570 Actinic keratosis: Secondary | ICD-10-CM | POA: Diagnosis not present

## 2018-04-13 DIAGNOSIS — D2230 Melanocytic nevi of unspecified part of face: Secondary | ICD-10-CM | POA: Diagnosis not present

## 2018-04-13 DIAGNOSIS — D225 Melanocytic nevi of trunk: Secondary | ICD-10-CM | POA: Diagnosis not present

## 2018-04-26 ENCOUNTER — Encounter: Payer: Self-pay | Admitting: Family Medicine

## 2018-04-26 ENCOUNTER — Other Ambulatory Visit: Payer: Self-pay | Admitting: Family Medicine

## 2018-04-26 ENCOUNTER — Ambulatory Visit (INDEPENDENT_AMBULATORY_CARE_PROVIDER_SITE_OTHER): Payer: 59 | Admitting: Family Medicine

## 2018-04-26 VITALS — BP 104/62 | HR 62 | Temp 98.2°F | Ht 70.0 in | Wt 182.0 lb

## 2018-04-26 DIAGNOSIS — Z125 Encounter for screening for malignant neoplasm of prostate: Secondary | ICD-10-CM

## 2018-04-26 DIAGNOSIS — N138 Other obstructive and reflux uropathy: Secondary | ICD-10-CM

## 2018-04-26 DIAGNOSIS — Z0000 Encounter for general adult medical examination without abnormal findings: Secondary | ICD-10-CM

## 2018-04-26 DIAGNOSIS — Z23 Encounter for immunization: Secondary | ICD-10-CM

## 2018-04-26 DIAGNOSIS — N401 Enlarged prostate with lower urinary tract symptoms: Secondary | ICD-10-CM

## 2018-04-26 DIAGNOSIS — Z1159 Encounter for screening for other viral diseases: Secondary | ICD-10-CM

## 2018-04-26 DIAGNOSIS — Z8249 Family history of ischemic heart disease and other diseases of the circulatory system: Secondary | ICD-10-CM

## 2018-04-26 DIAGNOSIS — Z8279 Family history of other congenital malformations, deformations and chromosomal abnormalities: Secondary | ICD-10-CM

## 2018-04-26 DIAGNOSIS — Z8342 Family history of familial hypercholesterolemia: Secondary | ICD-10-CM

## 2018-04-26 DIAGNOSIS — Z7189 Other specified counseling: Secondary | ICD-10-CM | POA: Insufficient documentation

## 2018-04-26 LAB — COMPREHENSIVE METABOLIC PANEL
ALK PHOS: 59 U/L (ref 39–117)
ALT: 23 U/L (ref 0–53)
AST: 17 U/L (ref 0–37)
Albumin: 4.4 g/dL (ref 3.5–5.2)
BUN: 15 mg/dL (ref 6–23)
CALCIUM: 9.3 mg/dL (ref 8.4–10.5)
CO2: 27 mEq/L (ref 19–32)
Chloride: 105 mEq/L (ref 96–112)
Creatinine, Ser: 1.09 mg/dL (ref 0.40–1.50)
GFR: 74.38 mL/min (ref >60.00)
Glucose, Bld: 97 mg/dL (ref 70–99)
POTASSIUM: 4.5 meq/L (ref 3.5–5.1)
Sodium: 139 mEq/L (ref 135–145)
Total Bilirubin: 0.9 mg/dL (ref 0.2–1.2)
Total Protein: 6.8 g/dL (ref 6.0–8.3)

## 2018-04-26 LAB — LIPID PANEL
CHOLESTEROL: 160 mg/dL (ref 0–200)
HDL: 55.4 mg/dL (ref >39.00)
LDL Cholesterol: 88 mg/dL (ref 0–99)
NonHDL: 104.26
TRIGLYCERIDES: 79 mg/dL (ref 0.0–149.0)
Total CHOL/HDL Ratio: 3
VLDL: 15.8 mg/dL (ref 0.0–40.0)

## 2018-04-26 LAB — CBC WITH DIFFERENTIAL/PLATELET
Basophils Absolute: 0 10*3/uL (ref 0.0–0.1)
Basophils Relative: 0.6 % (ref 0.0–3.0)
EOS PCT: 1.5 % (ref 0.0–5.0)
Eosinophils Absolute: 0.1 10*3/uL (ref 0.0–0.7)
HEMATOCRIT: 44.7 % (ref 39.0–52.0)
Hemoglobin: 15.2 g/dL (ref 13.0–17.0)
LYMPHS ABS: 1.3 10*3/uL (ref 0.7–4.0)
LYMPHS PCT: 31.4 % (ref 12.0–46.0)
MCHC: 34.1 g/dL (ref 30.0–36.0)
MCV: 92.8 fl (ref 78.0–100.0)
MONOS PCT: 9.3 % (ref 3.0–12.0)
Monocytes Absolute: 0.4 10*3/uL (ref 0.1–1.0)
Neutro Abs: 2.4 10*3/uL (ref 1.4–7.7)
Neutrophils Relative %: 57.2 % (ref 43.0–77.0)
Platelets: 162 10*3/uL (ref 150.0–400.0)
RBC: 4.82 Mil/uL (ref 4.22–5.81)
RDW: 13.6 % (ref 11.5–15.5)
WBC: 4.3 10*3/uL (ref 4.0–10.5)

## 2018-04-26 LAB — PSA: PSA: 0.47 ng/mL (ref 0.10–4.00)

## 2018-04-26 NOTE — Patient Instructions (Signed)
Great to meet you.  I will call you with your lab results from today and you can view them online.   

## 2018-04-26 NOTE — Progress Notes (Signed)
Subjective:   Patient ID: Tony Buck, male    DOB: 04-10-1961, 57 y.o.   MRN: [id number]  Tony Buck is a pleasant 57 y.o. year old male who presents to clinic today with New Patient (Initial Visit) (Patient is here today to transfer care from Phoebe Sharps, MD to Arnette Norris, MD and establish.  He had black coffee only this am.  He agrees to get the Tdap today.)  on 04/26/2018  HPI:  Doing well.  Has not been to doctor (other than his dermatologist yearly) in several years.  No complaints today.  Does not take any rxs regularly.  Does have strong family h/o HLD.  He also has a family history of enlarged aorta in several family members- two brother and his father.  He is not sure which type but will find out for me- ? AAA Lab Results  Component Value Date   CHOL 162 02/05/2015   HDL 55.10 02/05/2015   LDLCALC 76 02/05/2015   TRIG 156.0 (H) 02/05/2015   CHOLHDL 3 02/05/2015      No current outpatient medications on file prior to visit.   No current facility-administered medications on file prior to visit.     No Known Allergies  Past Medical History:  Diagnosis Date  . Depression 2012    Past Surgical History:  Procedure Laterality Date  . hand     Right hand surgery  . HAND SURGERY  1975    Family History  Problem Relation Age of Onset  . Hypertension Mother   . Hyperlipidemia Father   . Hypertension Father   . Arthritis Father   . Arthritis Paternal Grandmother     Social History   Socioeconomic History  . Marital status: Married    Spouse name: Not on file  . Number of children: Not on file  . Years of education: Not on file  . Highest education level: Not on file  Occupational History  . Not on file  Social Needs  . Financial resource strain: Not on file  . Food insecurity:    Worry: Not on file    Inability: Not on file  . Transportation needs:    Medical: Not on file    Non-medical: Not on file  Tobacco Use  . Smoking status:  Never Smoker  . Smokeless tobacco: Never Used  Substance and Sexual Activity  . Alcohol use: Yes    Comment: 3 beers a week  . Drug use: No  . Sexual activity: Not on file  Lifestyle  . Physical activity:    Days per week: Not on file    Minutes per session: Not on file  . Stress: Not on file  Relationships  . Social connections:    Talks on phone: Not on file    Gets together: Not on file    Attends religious service: Not on file    Active member of club or organization: Not on file    Attends meetings of clubs or organizations: Not on file    Relationship status: Not on file  . Intimate partner violence:    Fear of current or ex partner: Not on file    Emotionally abused: Not on file    Physically abused: Not on file    Forced sexual activity: Not on file  Other Topics Concern  . Not on file  Social History Narrative  . Not on file   The PMH, PSH, Social History, Family History, Medications, and allergies have  been reviewed in [facility], and have been updated if relevant.  Review of Systems  Constitutional: Negative.   HENT: Negative.   Eyes: Negative.   Respiratory: Negative.   Cardiovascular: Negative.   Gastrointestinal: Negative.   Endocrine: Negative.   Genitourinary: Negative.   Musculoskeletal: Negative.   Skin: Negative.   Allergic/Immunologic: Negative.   Neurological: Negative.   Hematological: Negative.   Psychiatric/Behavioral: Negative.   All other systems reviewed and are negative.      Objective:    BP 104/62 (BP Location: Left Arm, Patient Position: Sitting, Cuff Size: Normal)   Pulse 62   Temp 98.2 F (36.8 C) (Oral)   Ht 5\' 10"  (1.778 m)   Wt 182 lb (82.6 kg)   SpO2 (!) 87%   BMI 26.11 kg/m    Physical Exam  General:  pleasant male in no acute distress Eyes:  PERRL Ears:  External ear exam shows no significant lesions or deformities.  TMs normal bilaterally Hearing is grossly normal bilaterally. Nose:  External nasal examination  shows no deformity or inflammation. Nasal mucosa are pink and moist without lesions or exudates. Mouth:  Oral mucosa and oropharynx without lesions or exudates.  Teeth in good repair. Neck:  no carotid bruit or thyromegaly no cervical or supraclavicular lymphadenopathy  Lungs:  Normal respiratory effort, chest expands symmetrically. Lungs are clear to auscultation, no crackles or wheezes. Heart:  Normal rate and regular rhythm. S1 and S2 normal without gallop, murmur, click, rub or other extra sounds. Abdomen:  Bowel sounds positive,abdomen soft and non-tender without masses, organomegaly or hernias noted. Pulses:  R and L posterior tibial pulses are full and equal bilaterally  Extremities:  no edema  Psych:  Good eye contact, not anxious or depressed appearing      Assessment & Plan:   Visit for well man health check  Need for Tdap vaccination - Plan: Tdap vaccine greater than or equal to 7yo IM  Screening for prostate cancer - Plan: PSA  Family history of high cholesterol - Plan: Comprehensive metabolic panel, Lipid panel  BPH with obstruction/lower urinary tract symptoms - Plan: CBC with Differential/Platelet, PSA No follow-ups on file.

## 2018-04-26 NOTE — Assessment & Plan Note (Signed)
Reviewed preventive care protocols, scheduled due services, and updated immunizations Discussed nutrition, exercise, diet, and healthy lifestyle.  Orders Placed This Encounter  Procedures  . Tdap vaccine greater than or equal to 56yo IM  . CBC with Differential/Platelet  . Comprehensive metabolic panel  . Lipid panel  . PSA  . Hepatitis C Antibody

## 2018-04-27 LAB — HEPATITIS C ANTIBODY
HEP C AB: NONREACTIVE
SIGNAL TO CUT-OFF: 0.02 (ref <1.00)

## 2018-04-29 DIAGNOSIS — Z8279 Family history of other congenital malformations, deformations and chromosomal abnormalities: Secondary | ICD-10-CM

## 2018-04-29 DIAGNOSIS — Z8249 Family history of ischemic heart disease and other diseases of the circulatory system: Secondary | ICD-10-CM | POA: Insufficient documentation

## 2018-04-29 HISTORY — DX: Family history of other congenital malformations, deformations and chromosomal abnormalities: Z82.79

## 2018-04-29 HISTORY — DX: Family history of ischemic heart disease and other diseases of the circulatory system: Z82.49

## 2018-04-29 NOTE — Progress Notes (Signed)
Cardiology Office Note:    Date:  05/04/2018   ID:  Tony Buck, DOB Mar 31, 1961, MRN [id number]  PCP:  Tony Passy, MD  Cardiologist:  Tony More, MD   Referring MD: Tony Passy, MD  ASSESSMENT:    1. Cardiac risk counseling   2. Family history of first degree relative with bicuspid aortic valve   3. Family history of aortic aneurysm   4. Thoracic aortic aneurysm without rupture ([place])    PLAN:    In order of problems listed above:  1. His family history is uncommon for a bicuspid aortic valve with approximately 10% of the first-degree relatives with the same diagnosis it is Buck suggestive of an  Aortopathy and for further evaluation I asked her to schedule CTA of his chest. 2. Echocardiogram ordered he has no physical findings of bicuspid valve 3. See discussion under #1 4. See discussion under #1  Next appointment as needed if the CTA and echocardiogram are normal.  If he has aortopathy genetic testing should be considered   Medication Adjustments/Labs and Tests Ordered: Current medicines are reviewed at length with the patient today.  Concerns regarding medicines are outlined above.  Orders Placed This Encounter  Procedures  . CT ANGIO CHEST AORTA W &/OR WO CONTRAST  . EKG 12-Lead  . ECHOCARDIOGRAM COMPLETE   No orders of the defined types were placed in this encounter.    Chief Complaint  Patient presents with  . New Patient (Initial Visit)    FH od valvular heart disease and aneurysm    History of Present Illness:    Tony Buck is a 57 y.o. male who is being seen today for the evaluation of aortic aneurysm and valvular heart disease at the request of Tony Passy, MD. He is concerned 2 brothers and father had a bicuspid aortic valve and some dilation of the thoracic aorta.  There is no family history of Marfan syndrome none of them required surgical intervention for valve disease or aneurysm although his father did have bypass surgery.  He has  had no lens dislocation or joint dislocation and no first-degree relative with sudden death in early age.  He is never had a heart murmur appreciated no chest pain shortness of breath palpitation or syncope and no history of congenital or rheumatic heart disease  Past Medical History:  Diagnosis Date  . Depression 2012  . Family history of aortic aneurysm 04/29/2018  . Family history of first degree relative with bicuspid aortic valve 04/29/2018    Past Surgical History:  Procedure Laterality Date  . hand     Right hand surgery  . HAND SURGERY  1975    Current Medications: No outpatient medications have been marked as taking for the 05/04/18 encounter (Office Visit) with Richardo Priest, MD.     Allergies:   Patient has no known allergies.   Social History   Socioeconomic History  . Marital status: Married    Spouse name: Not on file  . Number of children: Not on file  . Years of education: Not on file  . Highest education level: Not on file  Occupational History  . Not on file  Social Needs  . Financial resource strain: Not on file  . Food insecurity:    Worry: Not on file    Inability: Not on file  . Transportation needs:    Medical: Not on file    Non-medical: Not on file  Tobacco Use  . Smoking  status: Never Smoker  . Smokeless tobacco: Never Used  Substance and Sexual Activity  . Alcohol use: Yes    Comment: 3 beers a week  . Drug use: No  . Sexual activity: Not on file  Lifestyle  . Physical activity:    Days per week: Not on file    Minutes per session: Not on file  . Stress: Not on file  Relationships  . Social connections:    Talks on phone: Not on file    Gets together: Not on file    Attends religious service: Not on file    Active member of club or organization: Not on file    Attends meetings of clubs or organizations: Not on file    Relationship status: Not on file  Other Topics Concern  . Not on file  Social History Narrative  . Not on file      Family History: The patient's family history includes Arthritis in his father and paternal grandmother; Heart disease in his father; Hyperlipidemia in his father; Hypertension in his father and mother; Other in his brother, brother, and father.  ROS:   ROS Please see the history of present illness.     All other systems reviewed and are negative.  EKGs/Labs/Other Studies Reviewed:    The following studies were reviewed today:   EKG:  EKG is  ordered today.  The ekg ordered today demonstrates sinus rhythm normal  Recent Labs: 04/26/2018: ALT 23; BUN 15; Creatinine, Ser 1.09; Hemoglobin 15.2; Platelets 162.0; Potassium 4.5; Sodium 139  Recent Lipid Panel    Component Value Date/Time   CHOL 160 04/26/2018 0855   TRIG 79.0 04/26/2018 0855   HDL 55.40 04/26/2018 0855   CHOLHDL 3 04/26/2018 0855   VLDL 15.8 04/26/2018 0855   LDLCALC 88 04/26/2018 0855    Physical Exam:    VS:  BP 132/84 (BP Location: Left Arm, Patient Position: Sitting, Cuff Size: Normal)   Pulse 60   Ht 5\' 10"  (1.778 m)   Wt 181 lb 6.4 oz (82.3 kg)   SpO2 99%   BMI 26.03 kg/m     Wt Readings from Last 3 Encounters:  05/04/18 181 lb 6.4 oz (82.3 kg)  04/26/18 182 lb (82.6 kg)  02/05/15 190 lb 1.6 oz (86.2 kg)     GEN: He does not have a Marfan appearance he has no chest wall deformity no scoliosis and none of physical traits of Marfan syndrome.  Well nourished, well developed in no acute distress HEENT: Normal NECK: No JVD; No carotid bruits LYMPHATICS: No lymphadenopathy CARDIAC: No murmur or click RRR, no murmurs, rubs, gallops RESPIRATORY:  Clear to auscultation without rales, wheezing or rhonchi  ABDOMEN: Soft, non-tender, non-distended MUSCULOSKELETAL:  No edema; No deformity  SKIN: Warm and dry NEUROLOGIC:  Alert and oriented x 3 PSYCHIATRIC:  Normal affect     Signed, Tony More, MD  05/04/2018 11:56 AM    [place]

## 2018-05-04 ENCOUNTER — Ambulatory Visit (INDEPENDENT_AMBULATORY_CARE_PROVIDER_SITE_OTHER): Payer: 59 | Admitting: Cardiology

## 2018-05-04 ENCOUNTER — Encounter: Payer: Self-pay | Admitting: Cardiology

## 2018-05-04 VITALS — BP 132/84 | HR 60 | Ht 70.0 in | Wt 181.4 lb

## 2018-05-04 DIAGNOSIS — Z8249 Family history of ischemic heart disease and other diseases of the circulatory system: Secondary | ICD-10-CM

## 2018-05-04 DIAGNOSIS — Z8279 Family history of other congenital malformations, deformations and chromosomal abnormalities: Secondary | ICD-10-CM

## 2018-05-04 DIAGNOSIS — I712 Thoracic aortic aneurysm, without rupture, unspecified: Secondary | ICD-10-CM

## 2018-05-04 DIAGNOSIS — Z7189 Other specified counseling: Secondary | ICD-10-CM | POA: Diagnosis not present

## 2018-05-04 NOTE — Patient Instructions (Addendum)
Medication Instructions:  Your physician recommends that you continue on your current medications as directed. Please refer to the Current Medication list given to you today.  Labwork: NONE  Testing/Procedures: Non-Cardiac CT scanning, (CAT scanning), is a noninvasive, special x-ray that produces cross-sectional images of the body using x-rays and a computer. CT scans help physicians diagnose and treat medical conditions. For some CT exams, a contrast material is used to enhance visibility in the area of the body being studied. CT scans provide greater clarity and reveal more details than regular x-ray exams.  Your physician has requested that you have an echocardiogram. Echocardiography is a painless test that uses sound waves to create images of your heart. It provides your doctor with information about the size and shape of your heart and how well your heart's chambers and valves are working. This procedure takes approximately one hour. There are no restrictions for this procedure.  Number for billing: [phone] option 7  Follow-Up: Your physician recommends that you schedule a follow-up appointment as needed or if symptoms worsen or fail to improve.  Any Other Special Instructions Will Be Listed Below (If Applicable).     If you need a refill on your cardiac medications before your next appointment, please call your pharmacy.

## 2018-05-25 ENCOUNTER — Ambulatory Visit (HOSPITAL_BASED_OUTPATIENT_CLINIC_OR_DEPARTMENT_OTHER)
Admission: RE | Admit: 2018-05-25 | Discharge: 2018-05-25 | Disposition: A | Discharge status: Home / Self Care | Payer: 59 | Source: Ambulatory Visit | Attending: Cardiology | Admitting: Cardiology

## 2018-05-25 DIAGNOSIS — I712 Thoracic aortic aneurysm, without rupture, unspecified: Secondary | ICD-10-CM

## 2018-05-25 DIAGNOSIS — Z8279 Family history of other congenital malformations, deformations and chromosomal abnormalities: Secondary | ICD-10-CM

## 2018-05-25 DIAGNOSIS — I340 Nonrheumatic mitral (valve) insufficiency: Secondary | ICD-10-CM | POA: Diagnosis not present

## 2018-05-25 DIAGNOSIS — Z8249 Family history of ischemic heart disease and other diseases of the circulatory system: Secondary | ICD-10-CM | POA: Diagnosis not present

## 2018-05-25 MED ORDER — IOPAMIDOL (ISOVUE-370) INJECTION 76%
100.0000 mL | Freq: Once | INTRAVENOUS | Status: AC | PRN
  Administered 2018-05-25: 100 mL via INTRAVENOUS

## 2018-05-25 NOTE — Progress Notes (Signed)
Echocardiogram 2D Echocardiogram has been performed.  Tony Buck 05/25/2018, 1:44 PM

## 2018-08-17 IMAGING — CT CT ANGIO CHEST
2 of 6 series · 18 of 46 positions shown · IV contrast (APPLIED)
Comparison: None.

CLINICAL DATA: Family history of aortic aneurysm

EXAM:
CT ANGIOGRAPHY CHEST WITH CONTRAST
TECHNIQUE: Multidetector CT imaging of the chest was performed using the
standard protocol during bolus administration of intravenous
contrast. Multiplanar CT image reconstructions and MIPs were
obtained to evaluate the vascular anatomy.
CONTRAST:  100mL JGTCJT-PH6 IOPAMIDOL (JGTCJT-PH6) INJECTION 76%

[Series 4: axial arterial · axial · arterial · 0.73mm/px · z∈[-135,+180]mm · 15 of 119 slices shown]
[im 7/119  lung]
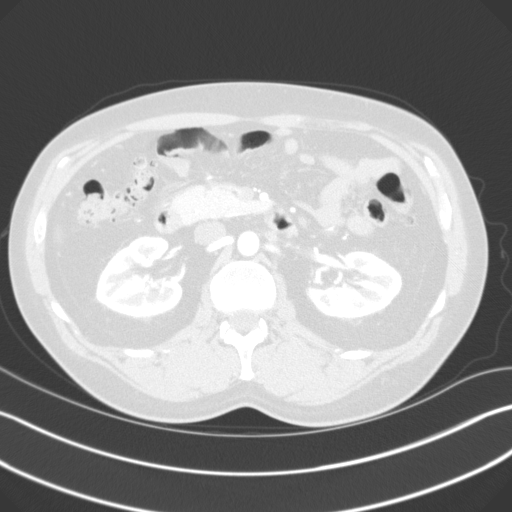
[im 13/119  soft-tissue]
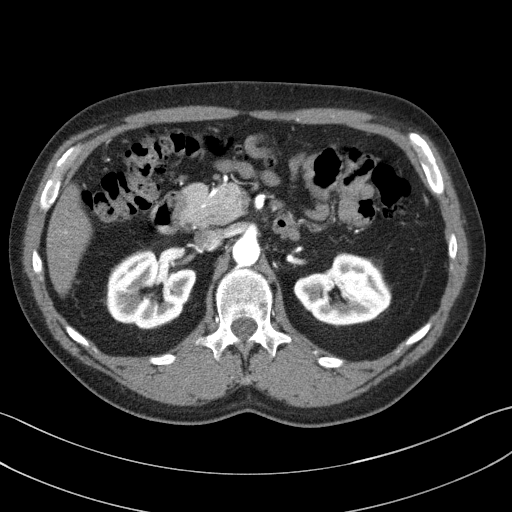
[im 25/119  lung]
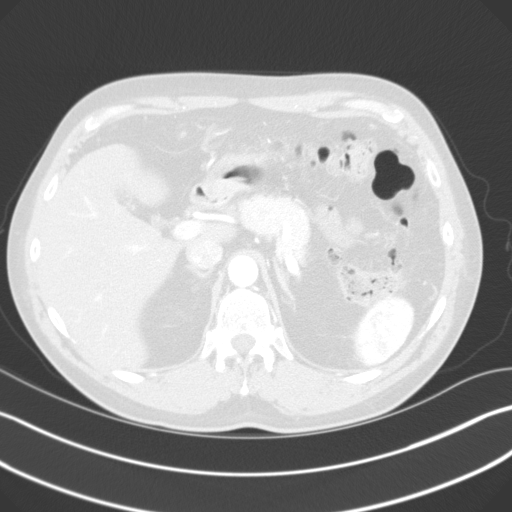
[im 32/119  soft-tissue]
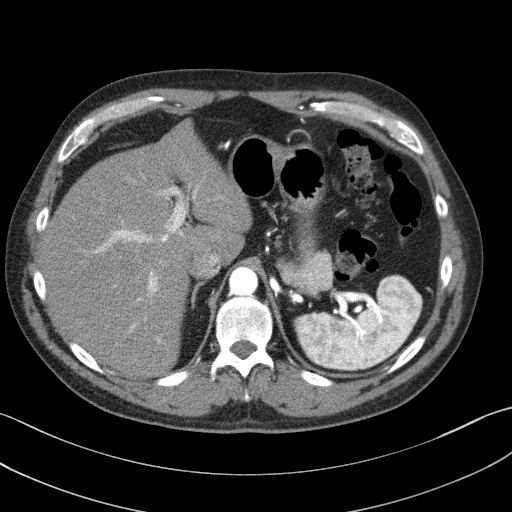
[im 38/119  lung]
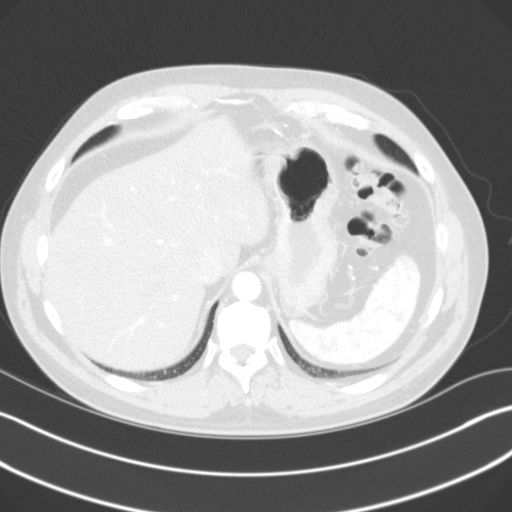
[im 44/119  soft-tissue]
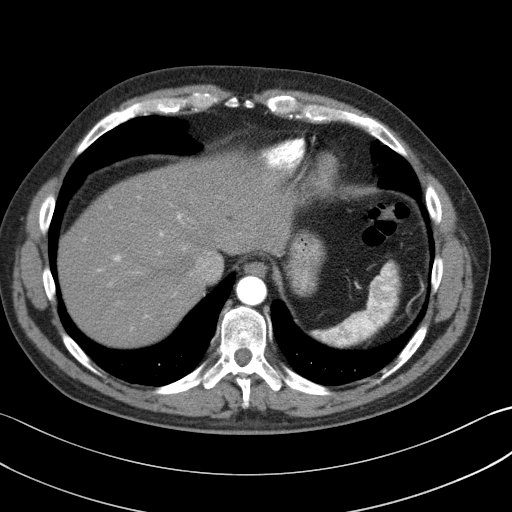
[im 50/119  lung]
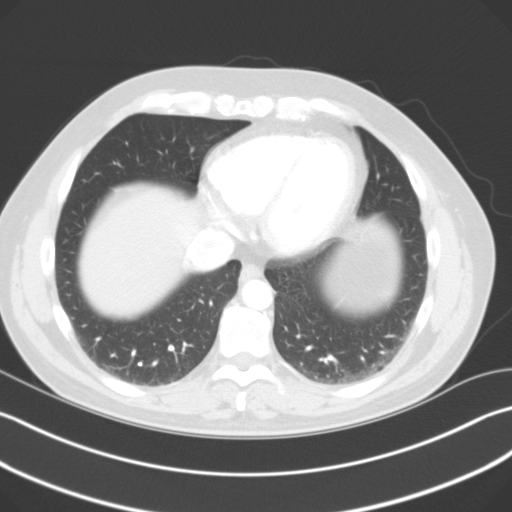
[im 63/119  soft-tissue]
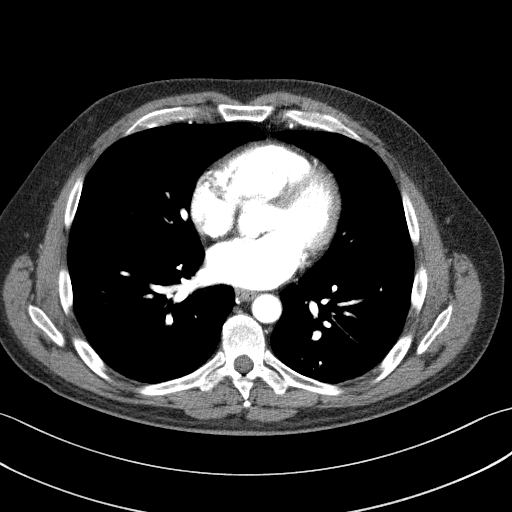
[im 69/119  lung]
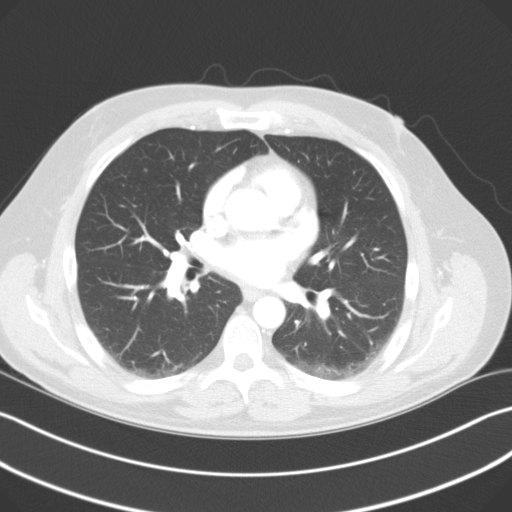
[im 75/119  soft-tissue]
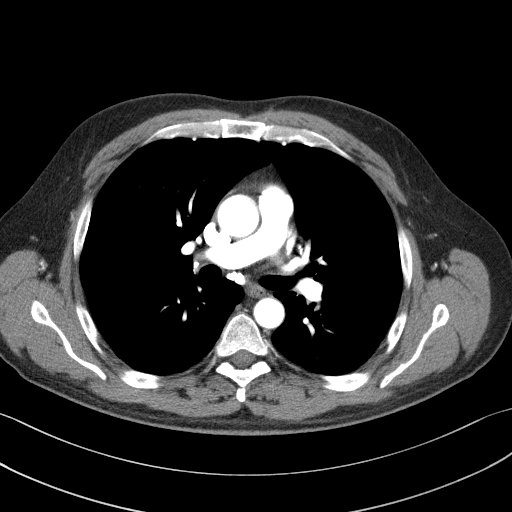
[im 81/119  lung]
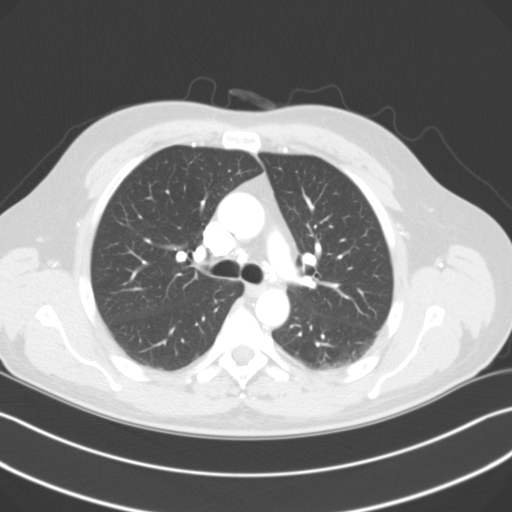
[im 87/119  soft-tissue]
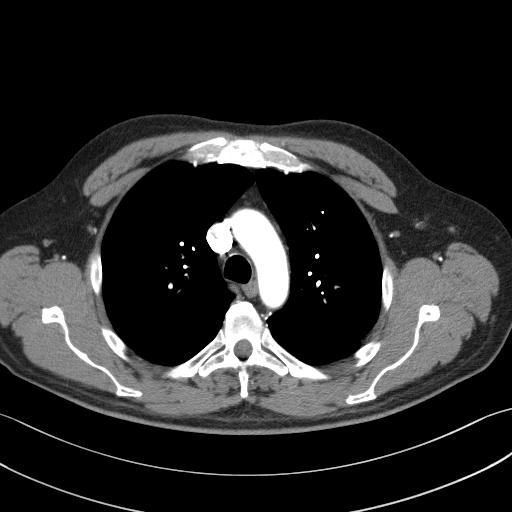
[im 100/119  lung]
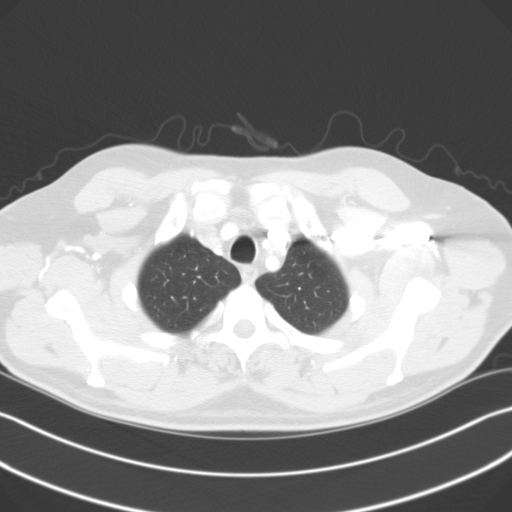
[im 106/119  soft-tissue]
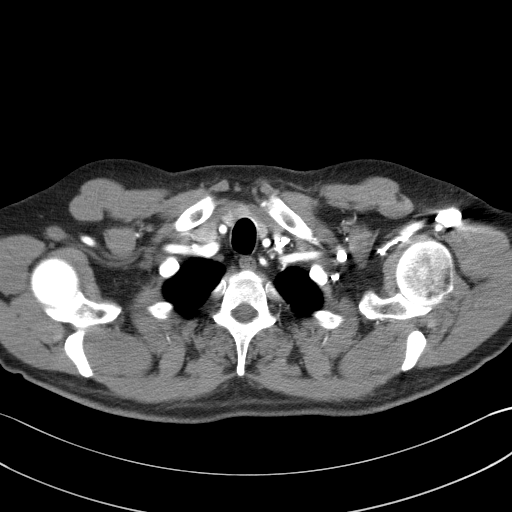
[im 112/119  lung]
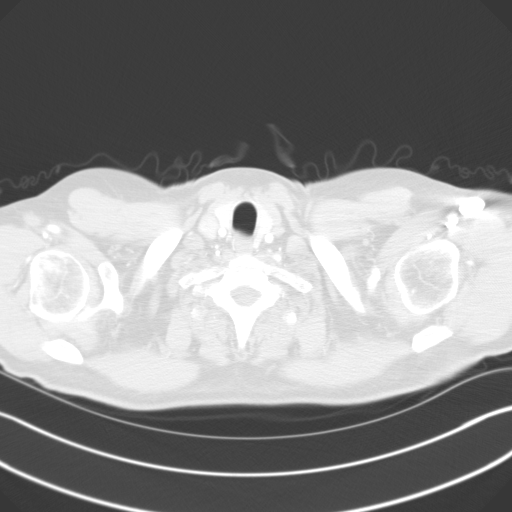

[Series 7: coronal · coronal · 0.71mm/px · 3 of 93 slices shown]
[im 24/93  soft-tissue]
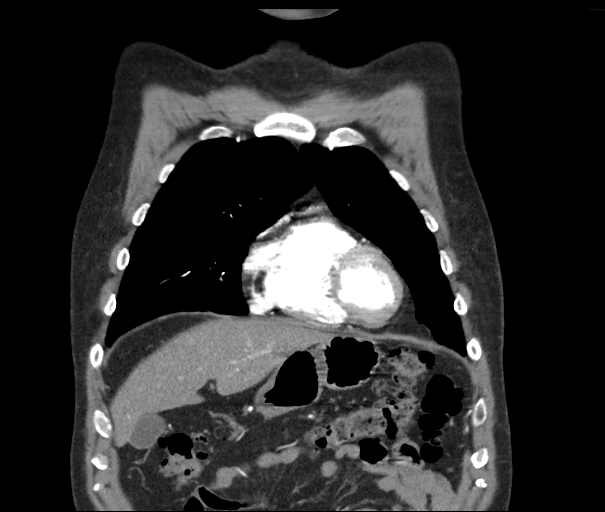
[im 47/93  soft-tissue]
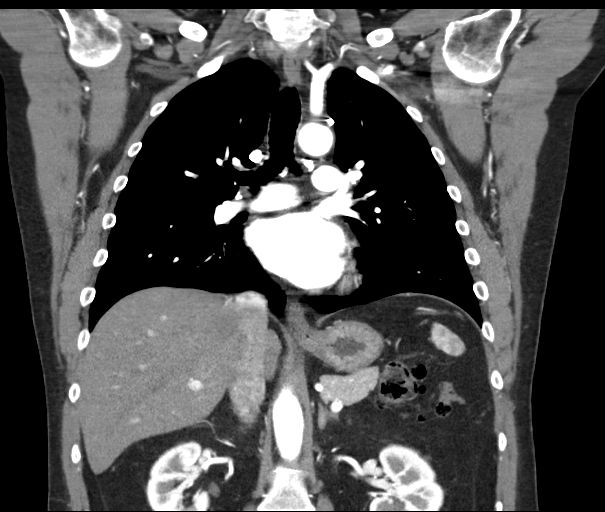
[im 70/93  soft-tissue]
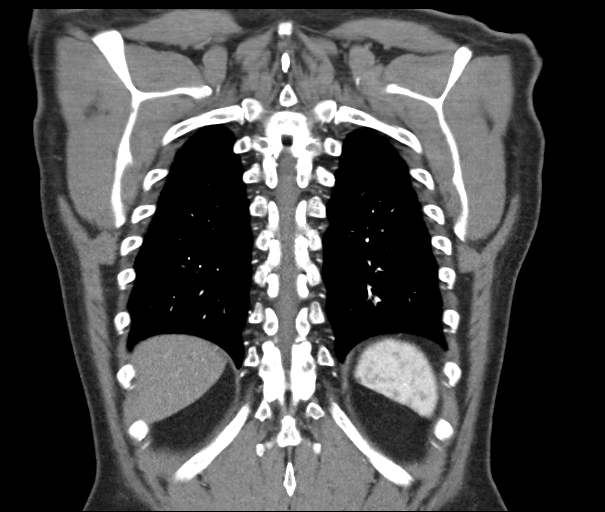

[18 of 46 positions shown; findings below may reference images not displayed]

FINDINGS: Cardiovascular: Thoracic aorta is well visualize without evidence of
aneurysmal dilatation or dissection. No significant atherosclerotic
changes are seen. Tricuspid aortic valve is noted. No significant
coronary calcifications are seen. Pulmonary artery as visualized is
within normal limits.

Mediastinum/Nodes: Changes consistent with prior granulomatous
disease are noted with hilar and mediastinal calcified lymph nodes
identified. The thoracic inlet is unremarkable. The esophagus as
visualized is within limits.

Lungs/Pleura: Lungs are well aerated bilaterally. Multiple calcified
granulomas are identified bilaterally. No focal infiltrate or
sizable effusion is seen.

Upper Abdomen: Fatty infiltration of the liver is noted.

Musculoskeletal: Mild degenerative changes of the thoracic spine are
noted.

Review of the MIP images confirms the above findings.
IMPRESSION: No evidence of thoracic aortic aneurysm. No atherosclerotic changes
are noted. Normal tricuspid aortic valve is seen.

Changes consistent with prior granulomatous disease.

## 2018-09-07 DIAGNOSIS — Z23 Encounter for immunization: Secondary | ICD-10-CM | POA: Diagnosis not present

## 2019-05-10 ENCOUNTER — Telehealth: Payer: Self-pay | Admitting: Family Medicine

## 2019-05-10 NOTE — Telephone Encounter (Signed)
Called pt about message sent in: Appointment Request From: Tony Buck    With Provider: Arnette Norris, MD [[facility]]    Preferred Date Range: 05/10/2019 - 06/16/2019    Preferred Times: Any Time    Reason for visit: Request an Appointment    Comments:  Annual blood test and physical   Left message

## 2019-05-17 NOTE — Progress Notes (Addendum)
Subjective:   Patient ID: Tony Buck, male    DOB: 1961-08-31, 58 y.o.   MRN: [id number]  Tony Buck is a pleasant 58 y.o. year old male who presents to clinic today with Annual Exam (Pt prescreened at vehicle. Pt is here today for a CPE.  He is UTD on immunizations and Colonoscopy.  He is currently fasting.)  on 05/18/2019  HPI:  Health Maintenance  Topic Date Due  . INFLUENZA VACCINE  06/25/2019  . COLONOSCOPY  04/09/2023  . TETANUS/TDAP  04/26/2028  . Hepatitis C Screening  Completed  . HIV Screening  Discontinued    Doing well.   No complaints today.  Does not take any rxs regularly.  Does have strong family h/o HLD.  He also has a family history of enlarged aorta and bicuspid valve in several family members- two brother and his father.  Echo done last year- 05/25/18 reviewed and was reassuring:  Study Conclusions  - Left ventricle: The cavity size was normal. Systolic function was   normal. Wall motion was normal; there were no regional wall   motion abnormalities. - Mitral valve: There was mild regurgitation. - Pulmonary arteries: PA peak pressure: 31 mm Hg (S).  Impressions:  - 1. Normal systolic function.   2. Mild MR and TR.  ------------------------------------------------------------------- Study data:  No prior study was available for comparison.  Study status:  Routine.  Procedure:  The patient reported no pain pre or post test. Transthoracic echocardiography. Image quality was adequate.  Study completion:  There were no complications. Transthoracic echocardiography.  M-mode, complete 2D, spectral Doppler, and color Doppler.  Birthdate:  Patient birthdate: June 27, 1961.  Age:  Patient is 58 yr old.  Sex:  Gender: male. BMI: 26 kg/m^2.  Blood pressure:     132/84  Patient status: Outpatient.  Study date:  Study date: 05/25/2018. Study time: 01:00 PM.  Location:  Echo laboratory.   -------------------------------------------------------------------  ------------------------------------------------------------------- Left ventricle:  The cavity size was normal. Systolic function was normal. Wall motion was normal; there were no regional wall motion abnormalities.  ------------------------------------------------------------------- Aortic valve:   Trileaflet; normal thickness leaflets. Mobility was not restricted.  Doppler:  Transvalvular velocity was within the normal range. There was no stenosis.  ------------------------------------------------------------------- Aorta:  Aortic root: The aortic root was normal in size.  ------------------------------------------------------------------- Mitral valve:   Structurally normal valve.   Mobility was not restricted.  Doppler:  Transvalvular velocity was within the normal range. There was no evidence for stenosis. There was mild regurgitation.  ------------------------------------------------------------------- Left atrium:  The atrium was normal in size.  ------------------------------------------------------------------- Right ventricle:  The cavity size was normal. Wall thickness was normal. Systolic function was normal.  ------------------------------------------------------------------- Pulmonic valve:    Doppler:  Transvalvular velocity was within the normal range. There was no evidence for stenosis.  ------------------------------------------------------------------- Tricuspid valve:   Structurally normal valve.    Doppler: Transvalvular velocity was within the normal range. There was mild regurgitation.  ------------------------------------------------------------------- Pulmonary artery:   The main pulmonary artery was normal-sized. Systolic pressure was within the normal range.  ------------------------------------------------------------------- Right atrium:  The atrium was normal in  size.  ------------------------------------------------------------------- Pericardium:  There was no pericardial effusion.  ------------------------------------------------------------------- Systemic veins: Inferior vena cava: The vessel was normal in size.  Lab Results  Component Value Date   CHOL 160 04/26/2018   HDL 55.40 04/26/2018   LDLCALC 88 04/26/2018   TRIG 79.0 04/26/2018   CHOLHDL 3 04/26/2018    Chest pain-  Sternal and sometimes  moves to the left for past two weeks.  STarted after he started golfing again.  He can use the Eliptical for long periods of time and not get this pain. It is actually worse in the morning.  And will deep breaths. No nausea, vomiting or diaphoresis.      No current outpatient medications on file prior to visit.   No current facility-administered medications on file prior to visit.     No Known Allergies  Past Medical History:  Diagnosis Date  . Depression 2012  . Family history of aortic aneurysm 04/29/2018  . Family history of first degree relative with bicuspid aortic valve 04/29/2018    Past Surgical History:  Procedure Laterality Date  . hand     Right hand surgery  . HAND SURGERY  1975    Family History  Problem Relation Age of Onset  . Hypertension Mother   . Hyperlipidemia Father   . Hypertension Father   . Arthritis Father   . Other Father        enlarged upper Aorta and a bicuspial heart valve  . Heart disease Father   . Arthritis Paternal Grandmother   . Other Brother        enlarged upper Aorta and a bicuspial heart valve  . Other Brother        enlarged upper Aorta and a bicuspial heart valve    Social History   Socioeconomic History  . Marital status: Married    Spouse name: Not on file  . Number of children: Not on file  . Years of education: Not on file  . Highest education level: Not on file  Occupational History  . Not on file  Social Needs  . Financial resource strain: Not on file  . Food  insecurity    Worry: Not on file    Inability: Not on file  . Transportation needs    Medical: Not on file    Non-medical: Not on file  Tobacco Use  . Smoking status: Never Smoker  . Smokeless tobacco: Never Used  Substance and Sexual Activity  . Alcohol use: Yes    Comment: 3 beers a week  . Drug use: No  . Sexual activity: Not on file  Lifestyle  . Physical activity    Days per week: Not on file    Minutes per session: Not on file  . Stress: Not on file  Relationships  . Social Herbalist on phone: Not on file    Gets together: Not on file    Attends religious service: Not on file    Active member of club or organization: Not on file    Attends meetings of clubs or organizations: Not on file    Relationship status: Not on file  . Intimate partner violence    Fear of current or ex partner: Not on file    Emotionally abused: Not on file    Physically abused: Not on file    Forced sexual activity: Not on file  Other Topics Concern  . Not on file  Social History Narrative  . Not on file   The PMH, PSH, Social History, Family History, Medications, and allergies have been reviewed in [facility], and have been updated if relevant.  Review of Systems  Constitutional: Negative.   HENT: Negative.   Eyes: Negative.   Respiratory: Negative.   Cardiovascular: Negative.   Gastrointestinal: Negative.   Endocrine: Negative.   Genitourinary: Negative.   Musculoskeletal:  Negative.   Skin: Negative.   Allergic/Immunologic: Negative.   Neurological: Negative.   Hematological: Negative.   Psychiatric/Behavioral: Negative.   All other systems reviewed and are negative.      Objective:    BP 116/60 (BP Location: Left Arm, Patient Position: Sitting, Cuff Size: Normal)   Pulse (!) 56   Temp 98.6 F (37 C) (Oral)   Ht 5' 9.5" (1.765 m)   Wt 175 lb 3.2 oz (79.5 kg)   SpO2 95%   BMI 25.50 kg/m    Physical Exam  General:  pleasant male in no acute distress Eyes:   PERRL Ears:  External ear exam shows no significant lesions or deformities.  TMs normal bilaterally Hearing is grossly normal bilaterally. Nose:  External nasal examination shows no deformity or inflammation. Nasal mucosa are pink and moist without lesions or exudates. Mouth:  Oral mucosa and oropharynx without lesions or exudates.  Teeth in good repair. Neck:  no carotid bruit or thyromegaly no cervical or supraclavicular lymphadenopathy  Lungs:  Normal respiratory effort, chest expands symmetrically. Lungs are clear to auscultation, no crackles or wheezes. Chest pain elicited with deep breaths. Heart:  Normal rate and regular rhythm. S1 and S2 normal without gallop, murmur, click, rub or other extra sounds. Abdomen:  Bowel sounds positive,abdomen soft and non-tender without masses, organomegaly or hernias noted.. Pulses:  R and L posterior tibial pulses are full and equal bilaterally  Extremities:  no edema  Psych:  Good eye contact, not anxious or depressed appearing       Assessment & Plan:   Visit for well man health check - Plan: Comp Met (CMET), Lipid Profile, PSA, CBC w/Diff,   Screening for prostate cancer - Plan: PSA,   Family history of high cholesterol - Plan: Comp Met (CMET), Lipid Profile, CBC w/Diff,   Family history of aortic aneurysm - Plan: Comp Met (CMET), Lipid Profile, CBC w/Diff,  No follow-ups on file.

## 2019-05-18 ENCOUNTER — Encounter: Payer: Self-pay | Admitting: Family Medicine

## 2019-05-18 ENCOUNTER — Ambulatory Visit (INDEPENDENT_AMBULATORY_CARE_PROVIDER_SITE_OTHER): Payer: 59 | Admitting: Family Medicine

## 2019-05-18 VITALS — BP 116/60 | HR 56 | Temp 98.6°F | Ht 69.5 in | Wt 175.2 lb

## 2019-05-18 DIAGNOSIS — R0789 Other chest pain: Secondary | ICD-10-CM | POA: Insufficient documentation

## 2019-05-18 DIAGNOSIS — I712 Thoracic aortic aneurysm, without rupture, unspecified: Secondary | ICD-10-CM | POA: Insufficient documentation

## 2019-05-18 DIAGNOSIS — Z8342 Family history of familial hypercholesterolemia: Secondary | ICD-10-CM

## 2019-05-18 DIAGNOSIS — Z125 Encounter for screening for malignant neoplasm of prostate: Secondary | ICD-10-CM

## 2019-05-18 DIAGNOSIS — Z8249 Family history of ischemic heart disease and other diseases of the circulatory system: Secondary | ICD-10-CM | POA: Diagnosis not present

## 2019-05-18 DIAGNOSIS — R079 Chest pain, unspecified: Secondary | ICD-10-CM | POA: Diagnosis not present

## 2019-05-18 DIAGNOSIS — Z0000 Encounter for general adult medical examination without abnormal findings: Secondary | ICD-10-CM | POA: Insufficient documentation

## 2019-05-18 LAB — LIPID PANEL
Cholesterol: 164 mg/dL (ref 0–200)
HDL: 65.9 mg/dL (ref >39.00)
LDL Cholesterol: 90 mg/dL (ref 0–99)
NonHDL: 98.25
Total CHOL/HDL Ratio: 2
Triglycerides: 42 mg/dL (ref 0.0–149.0)
VLDL: 8.4 mg/dL (ref 0.0–40.0)

## 2019-05-18 LAB — CBC WITH DIFFERENTIAL/PLATELET
Basophils Absolute: 0 10*3/uL (ref 0.0–0.1)
Basophils Relative: 0.7 % (ref 0.0–3.0)
Eosinophils Absolute: 0.1 10*3/uL (ref 0.0–0.7)
Eosinophils Relative: 1.2 % (ref 0.0–5.0)
HCT: 41.8 % (ref 39.0–52.0)
Hemoglobin: 14.2 g/dL (ref 13.0–17.0)
Lymphocytes Relative: 29.4 % (ref 12.0–46.0)
Lymphs Abs: 1.3 10*3/uL (ref 0.7–4.0)
MCHC: 34 g/dL (ref 30.0–36.0)
MCV: 93.6 fl (ref 78.0–100.0)
Monocytes Absolute: 0.4 10*3/uL (ref 0.1–1.0)
Monocytes Relative: 8.5 % (ref 3.0–12.0)
Neutro Abs: 2.6 10*3/uL (ref 1.4–7.7)
Neutrophils Relative %: 60.2 % (ref 43.0–77.0)
Platelets: 145 10*3/uL — ABNORMAL LOW (ref 150.0–400.0)
RBC: 4.47 Mil/uL (ref 4.22–5.81)
RDW: 13.2 % (ref 11.5–15.5)
WBC: 4.3 10*3/uL (ref 4.0–10.5)

## 2019-05-18 LAB — COMPREHENSIVE METABOLIC PANEL
ALT: 26 U/L (ref 0–53)
AST: 19 U/L (ref 0–37)
Albumin: 4.5 g/dL (ref 3.5–5.2)
Alkaline Phosphatase: 50 U/L (ref 39–117)
BUN: 15 mg/dL (ref 6–23)
CO2: 29 mEq/L (ref 19–32)
Calcium: 9.3 mg/dL (ref 8.4–10.5)
Chloride: 104 mEq/L (ref 96–112)
Creatinine, Ser: 0.95 mg/dL (ref 0.40–1.50)
GFR: 81.71 mL/min (ref >60.00)
Glucose, Bld: 98 mg/dL (ref 70–99)
Potassium: 4.6 mEq/L (ref 3.5–5.1)
Sodium: 139 mEq/L (ref 135–145)
Total Bilirubin: 0.7 mg/dL (ref 0.2–1.2)
Total Protein: 6.8 g/dL (ref 6.0–8.3)

## 2019-05-18 LAB — PSA: PSA: 0.57 ng/mL (ref 0.10–4.00)

## 2019-05-18 LAB — TROPONIN I: TNIDX: 0 ug/l (ref 0.00–0.06)

## 2019-05-18 LAB — VITAMIN D 25 HYDROXY (VIT D DEFICIENCY, FRACTURES): VITD: 34.39 ng/mL (ref 30.00–100.00)

## 2019-05-18 NOTE — Assessment & Plan Note (Signed)
Seems most consistent with costochondritis.  History very reassuring that this is unlikely cardiac- advised NSAIDs (with food) and ice or heat and to to update me in 2 weeks.  See AVS. Will check troponin to be on safe side but my suspicion is very low for cardiac cause. .agere

## 2019-05-18 NOTE — Assessment & Plan Note (Signed)
Reviewed preventive care protocols, scheduled due services, and updated immunizations Discussed nutrition, exercise, diet, and healthy lifestyle.  Orders Placed This Encounter  Procedures  . Comp Met (CMET)  . Lipid Profile  . PSA  . CBC w/Diff

## 2019-05-18 NOTE — Addendum Note (Signed)
Addended by: Lucille Passy on: 05/18/2019 10:53 AM   Modules accepted: Orders

## 2019-05-18 NOTE — Patient Instructions (Addendum)
Great to see you.  Happy birthday! I will call you with your lab results from today and you can view them online.   Please take Alleve once or twice a day with FOOD for 1-2 weeks.   Costochondritis  Costochondritis is swelling and irritation (inflammation) of the tissue (cartilage) that connects your ribs to your breastbone (sternum). This causes pain in the front of your chest. The pain usually starts gradually and involves more than one rib. What are the causes? The exact cause of this condition is not always known. It results from stress on the cartilage where your ribs attach to your sternum. The cause of this stress could be:  Chest injury (trauma).  Exercise or activity, such as lifting.  Severe coughing. What increases the risk? You may be at higher risk for this condition if you:  Recently started a new exercise or work activity.  Have low levels of vitamin D.  Have a condition that makes you cough frequently. What are the signs or symptoms? The main symptom of this condition is chest pain. The pain:  Usually starts gradually and can be sharp or dull.  Gets worse with deep breathing, coughing, or exercise.  May be worse when you press on the sternum-rib connection (tenderness). How is this diagnosed? This condition is diagnosed based on your symptoms, medical history, and a physical exam. Your health care provider will check for tenderness when pressing on your sternum. This is the most important finding. You may also have tests to rule out other causes of chest pain. These may include:  A chest X-ray to check for lung problems.  An electrocardiogram (ECG) to see if you have a heart problem that could be causing the pain.  An imaging scan to rule out a chest or rib fracture. How is this treated? This condition usually goes away on its own over time. Your health care provider may prescribe an NSAID to reduce pain and inflammation. Your health care provider may also  suggest that you:  Rest and avoid activities that make pain worse.  Apply heat or cold to the area to reduce pain and inflammation.  Do exercises to stretch your chest muscles. If these treatments do not help, your health care provider may inject a numbing medicine at the sternum-rib connection to help relieve the pain. Follow these instructions at home:  Avoid activities that make pain worse. This includes any activities that use chest, abdominal, and side muscles.  If directed, put ice on the painful area: ? Put ice in a plastic bag. ? Place a towel between your skin and the bag. ? Leave the ice on for 20 minutes, 2-3 times a day.  If directed, apply heat to the affected area as often as told by your health care provider. Use the heat source that your health care provider recommends, such as a moist heat pack or a heating pad. ? Place a towel between your skin and the heat source. ? Leave the heat on for 20-30 minutes. ? Remove the heat if your skin turns bright red. This is especially important if you are unable to feel pain, heat, or cold. You may have a greater risk of getting burned.  Take over-the-counter and prescription medicines only as told by your health care provider.  Return to your normal activities as told by your health care provider. Ask your health care provider what activities are safe for you.  Keep all follow-up visits as told by your health care  provider. This is important. Contact a health care provider if:  You have chills or a fever.  Your pain does not go away or it gets worse.  You have a cough that does not go away (is persistent). Get help right away if:  You have shortness of breath. This information is not intended to replace advice given to you by your health care provider. Make sure you discuss any questions you have with your health care provider. Document Released: 08/20/2005 Document Revised: 08/12/2017 Document Reviewed: 03/05/2016 Elsevier  Interactive Patient Education  [employer].

## 2019-08-04 ENCOUNTER — Ambulatory Visit: Payer: 59 | Admitting: Family Medicine

## 2019-08-05 ENCOUNTER — Other Ambulatory Visit: Payer: Self-pay

## 2019-08-07 NOTE — Progress Notes (Signed)
Subjective:   Patient ID: Tony Buck, male    DOB: 05-30-1961, 58 y.o.   MRN: [id number]  Tony Buck is a pleasant 58 y.o. year old male who presents to clinic today with Chest Pain  on 08/08/2019  HPI:  Last saw him for this complaint on 05/18/19- note reviewed.   At that time he complained ofchest pain-  Sternal and sometimes moves to the left that started two week prior to coming in to see me.  Started after he started golfing again.  He reported that he could use the Eliptical for long periods of time and not get this pain. It is actually worse in the morning and only occurs at rest. Deep breaths make it worse.  No nausea, vomiting or diaphoresis.  At that time, given very recent negative cardiac workup along with his history, I felt his symptoms were most consistent with costochondritis.  Unlikely cardiac-but I did check a troponin to be on the safe side: which was 0.00.  I  advised NSAIDs (with food) and ice or heat and to to update me in 2 weeks. NSAIDs take the edge off.  Symptoms never really went away.  Now he gets this shooting pain throughout the day for 6 months so he actually thinks it has gotten worse. . Prior to these symptoms, he had a severe upper resp infection (?covid).  He coughed violently for months and questions if this is a sequelae of possible covid.  He last saw his cardiologist, Dr. Bettina Gavia on 05/04/18.  Note reviewed.  He advised continuing current medications and had an Echo done and a CT angio chest of aorta with and without contrast on 05/25/18 which showed:  IMPRESSION: No evidence of thoracic aortic aneurysm. No atherosclerotic changes are noted. Normal tricuspid aortic valve is seen.  Changes consistent with prior granulomatous disease.   Echo done last year- 05/25/18 reviewed and was reassuring:  Study Conclusions  - Left ventricle: The cavity size was normal. Systolic function was normal. Wall motion was normal; there were no  regional wall motion abnormalities. - Mitral valve: There was mild regurgitation. - Pulmonary arteries: PA peak pressure: 31 mm Hg (S).  Impressions:  - 1. Normal systolic function. 2. Mild MR and TR.  ------------------------------------------------------------------- Study data: No prior study was available for comparison. Study status: Routine. Procedure: The patient reported no pain pre or post test. Transthoracic echocardiography. Image quality was adequate. Study completion: There were no complications. Transthoracic echocardiography. M-mode, complete 2D, spectral Doppler, and color Doppler. Birthdate: Patient birthdate: 08-27-1961. Age: Patient is 58 yr old. Sex: Gender: male. BMI: 26 kg/m^2. Blood pressure: 132/84 Patient status: Outpatient. Study date: Study date: 05/25/2018. Study time: 01:00 PM. Location: Echo laboratory.  -------------------------------------------------------------------  ------------------------------------------------------------------- Left ventricle: The cavity size was normal. Systolic function was normal. Wall motion was normal; there were no regional wall motion abnormalities.  ------------------------------------------------------------------- Aortic valve: Trileaflet; normal thickness leaflets. Mobility was not restricted. Doppler: Transvalvular velocity was within the normal range. There was no stenosis.  ------------------------------------------------------------------- Aorta: Aortic root: The aortic root was normal in size.  ------------------------------------------------------------------- Mitral valve: Structurally normal valve. Mobility was not restricted. Doppler: Transvalvular velocity was within the normal range. There was no evidence for stenosis. There was mild regurgitation.  ------------------------------------------------------------------- Left atrium: The atrium was  normal in size.  ------------------------------------------------------------------- Right ventricle: The cavity size was normal. Wall thickness was normal. Systolic function was normal.  ------------------------------------------------------------------- Pulmonic valve: Doppler: Transvalvular velocity was within the normal range. There was no  evidence for stenosis.  ------------------------------------------------------------------- Tricuspid valve: Structurally normal valve. Doppler: Transvalvular velocity was within the normal range. There was mild regurgitation.  ------------------------------------------------------------------- Pulmonary artery: The main pulmonary artery was normal-sized. Systolic pressure was within the normal range.  ------------------------------------------------------------------- Right atrium: The atrium was normal in size.  ------------------------------------------------------------------- Pericardium: There was no pericardial effusion.  ------------------------------------------------------------------- Systemic veins: Inferior vena cava: The vessel was normal in size.    Lab Results  Component Value Date   CHOL 164 05/18/2019   HDL 65.90 05/18/2019   LDLCALC 90 05/18/2019   TRIG 42.0 05/18/2019   CHOLHDL 2 05/18/2019   The 10-year ASCVD risk score Mikey Bussing DC Jr., et al., 2013) is: 4.6%   Values used to calculate the score:     Age: 58 years     Sex: Male     Is Non-Hispanic African American: No     Diabetic: No     Tobacco smoker: No     Systolic Blood Pressure: Q000111Q mmHg     Is BP treated: No     HDL Cholesterol: 65.9 mg/dL     Total Cholesterol: 164 mg/dL   No current outpatient medications on file prior to visit.   No current facility-administered medications on file prior to visit.     No Known Allergies  Past Medical History:  Diagnosis Date  . Depression 2012  . Family history of aortic aneurysm  04/29/2018  . Family history of first degree relative with bicuspid aortic valve 04/29/2018    Past Surgical History:  Procedure Laterality Date  . hand     Right hand surgery  . HAND SURGERY  1975    Family History  Problem Relation Age of Onset  . Hypertension Mother   . Hyperlipidemia Father   . Hypertension Father   . Arthritis Father   . Other Father        enlarged upper Aorta and a bicuspial heart valve  . Heart disease Father   . Arthritis Paternal Grandmother   . Other Brother        enlarged upper Aorta and a bicuspial heart valve  . Other Brother        enlarged upper Aorta and a bicuspial heart valve    Social History   Socioeconomic History  . Marital status: Married    Spouse name: Not on file  . Number of children: Not on file  . Years of education: Not on file  . Highest education level: Not on file  Occupational History  . Not on file  Social Needs  . Financial resource strain: Not on file  . Food insecurity    Worry: Not on file    Inability: Not on file  . Transportation needs    Medical: Not on file    Non-medical: Not on file  Tobacco Use  . Smoking status: Never Smoker  . Smokeless tobacco: Never Used  Substance and Sexual Activity  . Alcohol use: Yes    Comment: 3 beers a week  . Drug use: No  . Sexual activity: Not on file  Lifestyle  . Physical activity    Days per week: Not on file    Minutes per session: Not on file  . Stress: Not on file  Relationships  . Social Herbalist on phone: Not on file    Gets together: Not on file    Attends religious service: Not on file    Active member of club or organization:  Not on file    Attends meetings of clubs or organizations: Not on file    Relationship status: Not on file  . Intimate partner violence    Fear of current or ex partner: Not on file    Emotionally abused: Not on file    Physically abused: Not on file    Forced sexual activity: Not on file  Other Topics Concern   . Not on file  Social History Narrative  . Not on file   The PMH, PSH, Social History, Family History, Medications, and allergies have been reviewed in [facility], and have been updated if relevant.     Review of Systems  Constitutional: Negative.   HENT: Negative.   Eyes: Negative.   Respiratory: Negative.   Cardiovascular: Positive for chest pain. Negative for palpitations and leg swelling.  Gastrointestinal: Negative.   Endocrine: Negative.   Genitourinary: Negative.   Musculoskeletal: Negative.   Allergic/Immunologic: Negative.   Neurological: Negative.   Hematological: Negative.   Psychiatric/Behavioral: Negative.   All other systems reviewed and are negative.      Objective:    BP 132/82 (BP Location: Left Arm, Patient Position: Sitting, Cuff Size: Normal)   Pulse 62   Temp 98.4 F (36.9 C) (Oral)   Ht 5\' 10"  (1.778 m)   Wt 174 lb 9.6 oz (79.2 kg)   SpO2 99%   BMI 25.05 kg/m    Physical Exam  General:  pleasant male in no acute distress Eyes:  PERRL Ears:  External ear exam shows no significant lesions or deformities.  TMs normal bilaterally Hearing is grossly normal bilaterally. Nose:  External nasal examination shows no deformity or inflammation. Nasal mucosa are pink and moist without lesions or exudates. Mouth:  Oral mucosa and oropharynx without lesions or exudates.  Teeth in good repair. Neck:  no carotid bruit or thyromegaly no cervical or supraclavicular lymphadenopathy  Lungs:  Normal respiratory effort, chest expands symmetrically. Lungs are clear to auscultation, no crackles or wheezes.  Points to left upper chest- deep breaths illicit pain immediately but no TTP over the area.   Heart:  Normal rate and regular rhythm. S1 and S2 normal without gallop, murmur, click, rub or other extra sounds. Pain in left upper chest is worsened with deep inspirations, palpating it does not make it worse. Abdomen:  Bowel sounds positive,abdomen soft and non-tender  without masses, organomegaly or hernias noted. Pulses:  R and L posterior tibial pulses are full and equal bilaterally  Extremities:  no edema  Psych:  Good eye contact, not anxious or depressed appearing       Assessment & Plan:   Atypical chest pain - Plan: EKG 12-Lead No follow-ups on file.

## 2019-08-08 ENCOUNTER — Ambulatory Visit (HOSPITAL_BASED_OUTPATIENT_CLINIC_OR_DEPARTMENT_OTHER)
Admission: RE | Admit: 2019-08-08 | Discharge: 2019-08-08 | Disposition: A | Discharge status: Home / Self Care | Payer: 59 | Source: Ambulatory Visit | Attending: Family Medicine | Admitting: Family Medicine

## 2019-08-08 ENCOUNTER — Encounter: Payer: Self-pay | Admitting: Family Medicine

## 2019-08-08 ENCOUNTER — Ambulatory Visit (INDEPENDENT_AMBULATORY_CARE_PROVIDER_SITE_OTHER): Payer: 59 | Admitting: Family Medicine

## 2019-08-08 ENCOUNTER — Other Ambulatory Visit: Payer: Self-pay

## 2019-08-08 VITALS — BP 132/82 | HR 62 | Temp 98.4°F | Ht 70.0 in | Wt 174.6 lb

## 2019-08-08 DIAGNOSIS — R0789 Other chest pain: Secondary | ICD-10-CM

## 2019-08-08 DIAGNOSIS — R0782 Intercostal pain: Secondary | ICD-10-CM | POA: Insufficient documentation

## 2019-08-08 MED ORDER — IOHEXOL 300 MG/ML  SOLN
100.0000 mL | Freq: Once | INTRAMUSCULAR | Status: AC | PRN
  Administered 2019-08-08: 80 mL via INTRAVENOUS

## 2019-08-08 NOTE — Assessment & Plan Note (Addendum)
>  40 minutes spent in face to face time with patient, >50% spent in counselling or coordination of care discussing his cardiac risk factors and recurrent chest pain.  EKG reassuring- unchanged from 2013, Now having the pain nonstop, while at rest for almost 6 months.  Certainly costochondritis could last 6 months or more but I would anticipate some TTP which he has none.  Cannot rule out pleuritis. Has had negative cardiac work up. Given duration and progression of symptoms, I do feel CT of chest is warranted.  The patient indicates understanding of these issues and agrees with the plan. Orders Placed This Encounter  Procedures  . CT CHEST W CONTRAST  . EKG 12-Lead

## 2019-08-08 NOTE — Patient Instructions (Signed)
Great to see you.  Please stop by up front to schedule your CT scan.

## 2019-08-09 ENCOUNTER — Other Ambulatory Visit: Payer: Self-pay | Admitting: Family Medicine

## 2019-08-09 DIAGNOSIS — J8410 Pulmonary fibrosis, unspecified: Secondary | ICD-10-CM

## 2019-08-09 DIAGNOSIS — R0789 Other chest pain: Secondary | ICD-10-CM

## 2019-08-13 ENCOUNTER — Encounter: Payer: Self-pay | Admitting: Family Medicine

## 2019-08-15 ENCOUNTER — Encounter: Payer: Self-pay | Admitting: Family Medicine

## 2019-08-25 ENCOUNTER — Other Ambulatory Visit: Payer: Self-pay

## 2019-08-25 ENCOUNTER — Ambulatory Visit (INDEPENDENT_AMBULATORY_CARE_PROVIDER_SITE_OTHER): Payer: 59 | Admitting: Pulmonary Disease

## 2019-08-25 ENCOUNTER — Encounter: Payer: Self-pay | Admitting: Pulmonary Disease

## 2019-08-25 VITALS — BP 130/82 | HR 60 | Temp 97.7°F | Ht 70.0 in | Wt 179.6 lb

## 2019-08-25 DIAGNOSIS — R0789 Other chest pain: Secondary | ICD-10-CM

## 2019-08-25 LAB — D-DIMER, QUANTITATIVE: D-Dimer, Quant: 0.19 mcg/mL FEU (ref <0.50)

## 2019-08-25 NOTE — Patient Instructions (Signed)
I have reviewed your CT scan.  It shows tiny lung nodules and calcification. 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 This is probably from your stay in the  and in [place] There is no active disease.  This does not explain the chest pain We will check a blood test called d-dimer today.   If negative we can follow-up as needed.

## 2019-08-25 NOTE — Progress Notes (Signed)
Jordache Mcelravy    [id number]    [birth year]  Primary Care Physician:Aron, Marciano Sequin, MD  Referring Physician: Lucille Passy, MD [place],  [place] [address]  Chief complaint:  Consult for granuloma  HPI: 58 year old with no significant past medical history. Complains of left chest pain for the past 8 months.  Patient had a viral type illness around February March 2020.  He feels this is likely COVID as he had contact from some people who returned from [place].  He was never tested for SARS-CoV-2.  He is constant chest pain centered around the left sternal since then.  It sometimes radiates to the right.  No aggravating, relieving factors.  Not associated with cough, deep breathing, dyspnea or wheezing.  He had a CT chest recently which showed calcified granulomas and he has been referred to pulmonary for further evaluation.  Findings not thought to be secondary to cardiac disease.  He had troponin sent recently which were negative.  Echocardiogram in 2019 was unremarkable  Pets: Has dogs.  No cats, birds, farm animals Occupation: Chief Financial Officer, Mudlogger for [employer] Exposures: No known exposures.  No mold, hot tub, Jacuzzi, down pillows or comforters Smoking history: Never smoker Travel history: Grew up in [place].  Lived in [place] for 5 years and [place] for 4 years.  Moved to [place] in 1997.  He used to travel throughout the country for his work until the [employer] pandemic. Relevant family history: No significant family history of lung disease.  No outpatient encounter medications on file as of 08/25/2019.   No facility-administered encounter medications on file as of 08/25/2019.     Allergies as of 08/25/2019  . (No Known Allergies)    Past Medical History:  Diagnosis Date  . Depression 2012  . Family history of aortic aneurysm 04/29/2018  . Family history of first degree relative with bicuspid aortic valve 04/29/2018    Past  Surgical History:  Procedure Laterality Date  . hand     Right hand surgery  . HAND SURGERY  1975    Family History  Problem Relation Age of Onset  . Hypertension Mother   . Hyperlipidemia Father   . Hypertension Father   . Arthritis Father   . Other Father        enlarged upper Aorta and a bicuspial heart valve  . Heart disease Father   . Arthritis Paternal Grandmother   . Other Brother        enlarged upper Aorta and a bicuspial heart valve  . Other Brother        enlarged upper Aorta and a bicuspial heart valve    Social History   Socioeconomic History  . Marital status: Married    Spouse name: Not on file  . Number of children: Not on file  . Years of education: Not on file  . Highest education level: Not on file  Occupational History  . Not on file  Social Needs  . Financial resource strain: Not on file  . Food insecurity    Worry: Not on file    Inability: Not on file  . Transportation needs    Medical: Not on file    Non-medical: Not on file  Tobacco Use  . Smoking status: Never Smoker  . Smokeless tobacco: Never Used  Substance and Sexual Activity  . Alcohol use: Yes    Comment: 3 beers a week  . Drug use:  No  . Sexual activity: Not on file  Lifestyle  . Physical activity    Days per week: Not on file    Minutes per session: Not on file  . Stress: Not on file  Relationships  . Social Herbalist on phone: Not on file    Gets together: Not on file    Attends religious service: Not on file    Active member of club or organization: Not on file    Attends meetings of clubs or organizations: Not on file    Relationship status: Not on file  . Intimate partner violence    Fear of current or ex partner: Not on file    Emotionally abused: Not on file    Physically abused: Not on file    Forced sexual activity: Not on file  Other Topics Concern  . Not on file  Social History Narrative  . Not on file    Review of systems: Review of  Systems  Constitutional: Negative for fever and chills.  HENT: Negative.   Eyes: Negative for blurred vision.  Respiratory: as per HPI  Cardiovascular: Negative for chest pain and palpitations.  Gastrointestinal: Negative for vomiting, diarrhea, blood per rectum. Genitourinary: Negative for dysuria, urgency, frequency and hematuria.  Musculoskeletal: Negative for myalgias, back pain and joint pain.  Skin: Negative for itching and rash.  Neurological: Negative for dizziness, tremors, focal weakness, seizures and loss of consciousness.  Endo/Heme/Allergies: Negative for environmental allergies.  Psychiatric/Behavioral: Negative for depression, suicidal ideas and hallucinations.  All other systems reviewed and are negative.  Physical Exam: Blood pressure 130/82, pulse 60, temperature 97.7 F (36.5 C), temperature source Temporal, height 5\' 10"  (1.778 m), weight 179 lb 9.6 oz (81.5 kg), SpO2 100 %. Gen:      No acute distress HEENT:  EOMI, sclera anicteric Neck:     No masses; no thyromegaly Lungs:    Clear to auscultation bilaterally; normal respiratory effort CV:         Regular rate and rhythm; no murmurs Abd:      + bowel sounds; soft, non-tender; no palpable masses, no distension Ext:    No edema; adequate peripheral perfusion Skin:      Warm and dry; no rash Neuro: alert and oriented x 3 Psych: normal mood and affect  Data Reviewed: Imaging: CTA 05/25/2018- calcified hilar, mediastinal lymph nodes, calcified bilateral granulomas CT chest 9/40/20-stable findings of calcified lymph nodes, granulomas.  I have reviewed the images personally.  Assessment:  Calcified granulomas I have reviewed the CT scan which shows stable calcified granulomas and adenopathy present as far back as 2019.  This is likely secondary to prior infection with independent fungal disease such as blasto, histo or coccidio from his stay in the [place] and [place].  I do not see any evidence of active disease or  findings in the lung that can explain his atypical chest pain. No further interventions needed at this point for the calcified granuloma.  This could be musculoskeletal related to viral infection, possible COVID-19 earlier this year.  Consider cardiac evaluation. Check a d-dimer to ensure no venous thromboembolism though suspicion for this is low.  Discussed in detail with patient.  Follow-up as needed if d-dimer is negative.  Plan/Recommendations: - D-dimer  Marshell Garfinkel MD [facility] 08/25/2019, 9:31 AM  CC: Lucille Passy, MD

## 2020-02-16 ENCOUNTER — Ambulatory Visit: Payer: 59 | Attending: Internal Medicine

## 2020-02-16 DIAGNOSIS — Z23 Encounter for immunization: Secondary | ICD-10-CM

## 2020-02-16 NOTE — Progress Notes (Signed)
   Covid-19 Vaccination Clinic  Name:  Tony Buck    MRN: [id number] DOB: [birth year]  02/16/2020  [name] was observed post Covid-19 immunization for 15 minutes without incident. He was provided with Vaccine Information Sheet and instruction to access the V-Safe system.   [name] was instructed to call 911 with any severe reactions post vaccine: Marland Kitchen Difficulty breathing  . Swelling of face and throat  . A fast heartbeat  . A bad rash all over body  . Dizziness and weakness   Immunizations Administered    Name Date Dose VIS Date Route   Pfizer COVID-19 Vaccine 02/16/2020  3:28 PM 0.3 mL 11/04/2019 Intramuscular   Manufacturer: [place]   Lot: [id number]   [place]: [id number]

## 2020-03-12 ENCOUNTER — Ambulatory Visit: Payer: 59 | Attending: Internal Medicine

## 2020-03-12 DIAGNOSIS — Z23 Encounter for immunization: Secondary | ICD-10-CM

## 2020-03-12 NOTE — Progress Notes (Signed)
   Covid-19 Vaccination Clinic  Name:  Tony Buck    MRN: [id number] DOB: [birth year]  03/12/2020  Mr. Angelina was observed post Covid-19 immunization for 15 minutes without incident. He was provided with Vaccine Information Sheet and instruction to access the V-Safe system.   Mr. Friedman was instructed to call 911 with any severe reactions post vaccine: Marland Kitchen Difficulty breathing  . Swelling of face and throat  . A fast heartbeat  . A bad rash all over body  . Dizziness and weakness   Immunizations Administered    Name Date Dose VIS Date Route   Pfizer COVID-19 Vaccine 03/12/2020  1:16 PM 0.3 mL 01/18/2019 Intramuscular   Manufacturer: [place]   Lot: [id number]   [place]: [id number]

## 2020-05-09 ENCOUNTER — Telehealth: Payer: Self-pay | Admitting: Family Medicine

## 2020-05-09 NOTE — Telephone Encounter (Signed)
Called pt to see what they want to do since Dr Deborra Medina is no longer here, Lvm for pt to call back

## 2022-10-15 ENCOUNTER — Other Ambulatory Visit: Payer: Self-pay | Admitting: Family Medicine

## 2022-10-15 DIAGNOSIS — Z8249 Family history of ischemic heart disease and other diseases of the circulatory system: Secondary | ICD-10-CM

## 2022-11-15 ENCOUNTER — Encounter (HOSPITAL_COMMUNITY): Payer: Self-pay

## 2022-11-15 ENCOUNTER — Other Ambulatory Visit: Payer: Self-pay

## 2022-11-15 ENCOUNTER — Observation Stay (HOSPITAL_COMMUNITY)
Admission: EM | Admit: 2022-11-15 | Discharge: 2022-11-16 | Disposition: A | Discharge status: Home / Self Care | Payer: 59 | Attending: Internal Medicine | Admitting: Internal Medicine

## 2022-11-15 DIAGNOSIS — F32A Depression, unspecified: Secondary | ICD-10-CM | POA: Diagnosis present

## 2022-11-15 DIAGNOSIS — E20810 Autosomal dominant hypocalcemia: Secondary | ICD-10-CM | POA: Insufficient documentation

## 2022-11-15 DIAGNOSIS — K921 Melena: Secondary | ICD-10-CM | POA: Diagnosis not present

## 2022-11-15 DIAGNOSIS — Z789 Other specified health status: Secondary | ICD-10-CM | POA: Diagnosis not present

## 2022-11-15 DIAGNOSIS — K2100 Gastro-esophageal reflux disease with esophagitis, without bleeding: Secondary | ICD-10-CM

## 2022-11-15 DIAGNOSIS — D62 Acute posthemorrhagic anemia: Secondary | ICD-10-CM | POA: Diagnosis not present

## 2022-11-15 DIAGNOSIS — K5730 Diverticulosis of large intestine without perforation or abscess without bleeding: Secondary | ICD-10-CM

## 2022-11-15 DIAGNOSIS — D649 Anemia, unspecified: Secondary | ICD-10-CM | POA: Insufficient documentation

## 2022-11-15 DIAGNOSIS — F1090 Alcohol use, unspecified, uncomplicated: Secondary | ICD-10-CM | POA: Diagnosis present

## 2022-11-15 DIAGNOSIS — Z79899 Other long term (current) drug therapy: Secondary | ICD-10-CM | POA: Insufficient documentation

## 2022-11-15 DIAGNOSIS — R55 Syncope and collapse: Secondary | ICD-10-CM | POA: Diagnosis not present

## 2022-11-15 DIAGNOSIS — K5731 Diverticulosis of large intestine without perforation or abscess with bleeding: Secondary | ICD-10-CM | POA: Diagnosis not present

## 2022-11-15 DIAGNOSIS — U071 COVID-19: Secondary | ICD-10-CM | POA: Diagnosis present

## 2022-11-15 DIAGNOSIS — K922 Gastrointestinal hemorrhage, unspecified: Secondary | ICD-10-CM | POA: Diagnosis present

## 2022-11-15 LAB — COMPREHENSIVE METABOLIC PANEL
ALT: 18 U/L (ref 0–44)
AST: 15 U/L (ref 15–41)
Albumin: 2.9 g/dL — ABNORMAL LOW (ref 3.5–5.0)
Alkaline Phosphatase: 40 U/L (ref 38–126)
Anion gap: 9 (ref 5–15)
BUN: 17 mg/dL (ref 6–20)
CO2: 22 mmol/L (ref 22–32)
Calcium: 7.9 mg/dL — ABNORMAL LOW (ref 8.9–10.3)
Chloride: 107 mmol/L (ref 98–111)
Creatinine, Ser: 1.04 mg/dL (ref 0.61–1.24)
GFR, Estimated: >60 mL/min (ref >60)
Glucose, Bld: 106 mg/dL — ABNORMAL HIGH (ref 70–99)
Potassium: 3.5 mmol/L (ref 3.5–5.1)
Sodium: 138 mmol/L (ref 135–145)
Total Bilirubin: 0.6 mg/dL (ref 0.3–1.2)
Total Protein: 5.2 g/dL — ABNORMAL LOW (ref 6.5–8.1)

## 2022-11-15 LAB — HEMOGLOBIN AND HEMATOCRIT, BLOOD
HCT: 32.8 % — ABNORMAL LOW (ref 39.0–52.0)
HCT: 32.3 % — ABNORMAL LOW (ref 39.0–52.0)
HCT: 31.4 % — ABNORMAL LOW (ref 39.0–52.0)
Hemoglobin: 11.4 g/dL — ABNORMAL LOW (ref 13.0–17.0)
Hemoglobin: 10.9 g/dL — ABNORMAL LOW (ref 13.0–17.0)
Hemoglobin: 11 g/dL — ABNORMAL LOW (ref 13.0–17.0)

## 2022-11-15 LAB — I-STAT CHEM 8, ED
BUN: 17 mg/dL (ref 6–20)
Calcium, Ion: 1.08 mmol/L — ABNORMAL LOW (ref 1.15–1.40)
Chloride: 104 mmol/L (ref 98–111)
Creatinine, Ser: 1.2 mg/dL (ref 0.61–1.24)
Glucose, Bld: 118 mg/dL — ABNORMAL HIGH (ref 70–99)
HCT: 36 % — ABNORMAL LOW (ref 39.0–52.0)
Hemoglobin: 12.2 g/dL — ABNORMAL LOW (ref 13.0–17.0)
Potassium: 3.9 mmol/L (ref 3.5–5.1)
Sodium: 140 mmol/L (ref 135–145)
TCO2: 25 mmol/L (ref 22–32)

## 2022-11-15 LAB — CBC WITH DIFFERENTIAL/PLATELET
Abs Immature Granulocytes: 0.02 10*3/uL (ref 0.00–0.07)
Basophils Absolute: 0 10*3/uL (ref 0.0–0.1)
Basophils Relative: 0 %
Eosinophils Absolute: 0 10*3/uL (ref 0.0–0.5)
Eosinophils Relative: 0 %
HCT: 34.8 % — ABNORMAL LOW (ref 39.0–52.0)
Hemoglobin: 11.8 g/dL — ABNORMAL LOW (ref 13.0–17.0)
Immature Granulocytes: 0 %
Lymphocytes Relative: 11 %
Lymphs Abs: 0.7 10*3/uL (ref 0.7–4.0)
MCH: 30.6 pg (ref 26.0–34.0)
MCHC: 33.9 g/dL (ref 30.0–36.0)
MCV: 90.4 fL (ref 80.0–100.0)
Monocytes Absolute: 0.5 10*3/uL (ref 0.1–1.0)
Monocytes Relative: 8 %
Neutro Abs: 4.9 10*3/uL (ref 1.7–7.7)
Neutrophils Relative %: 81 %
Platelets: 157 10*3/uL (ref 150–400)
RBC: 3.85 MIL/uL — ABNORMAL LOW (ref 4.22–5.81)
RDW: 12.6 % (ref 11.5–15.5)
WBC: 6.2 10*3/uL (ref 4.0–10.5)
nRBC: 0 % (ref 0.0–0.2)

## 2022-11-15 LAB — ABO/RH: ABO/RH(D): A POS

## 2022-11-15 LAB — TYPE AND SCREEN
ABO/RH(D): A POS
Antibody Screen: NEGATIVE

## 2022-11-15 LAB — RESP PANEL BY RT-PCR (RSV, FLU A&B, COVID)  RVPGX2
Influenza A by PCR: NEGATIVE
Influenza B by PCR: NEGATIVE
Resp Syncytial Virus by PCR: NEGATIVE
SARS Coronavirus 2 by RT PCR: POSITIVE — AB

## 2022-11-15 LAB — PROTIME-INR
INR: 1.1 (ref 0.8–1.2)
Prothrombin Time: 14.6 seconds (ref 11.4–15.2)

## 2022-11-15 LAB — HIV ANTIBODY (ROUTINE TESTING W REFLEX): HIV Screen 4th Generation wRfx: NONREACTIVE

## 2022-11-15 MED ORDER — FOLIC ACID 1 MG PO TABS
1.0000 mg | ORAL_TABLET | Freq: Every day | ORAL | Status: DC
  Administered 2022-11-15: 1 mg via ORAL
  Filled 2022-11-15: qty 1

## 2022-11-15 MED ORDER — ONDANSETRON HCL 4 MG PO TABS: 4.0000 mg | ORAL_TABLET | Freq: Four times a day (QID) | ORAL | Status: DC | PRN

## 2022-11-15 MED ORDER — SODIUM CHLORIDE 0.9 % IV BOLUS
1000.0000 mL | Freq: Once | INTRAVENOUS | Status: AC
  Administered 2022-11-15: 1000 mL via INTRAVENOUS

## 2022-11-15 MED ORDER — PEG-KCL-NACL-NASULF-NA ASC-C 100 G PO SOLR
0.5000 | Freq: Once | ORAL | Status: AC
  Administered 2022-11-16: 100 g via ORAL
  Filled 2022-11-15: qty 1

## 2022-11-15 MED ORDER — LORAZEPAM 2 MG/ML IJ SOLN: 1.0000 mg | INTRAMUSCULAR | Status: DC | PRN

## 2022-11-15 MED ORDER — LORATADINE 10 MG PO TABS
10.0000 mg | ORAL_TABLET | Freq: Every day | ORAL | Status: DC
  Filled 2022-11-15: qty 1

## 2022-11-15 MED ORDER — ALBUTEROL SULFATE (2.5 MG/3ML) 0.083% IN NEBU: 2.5000 mg | INHALATION_SOLUTION | Freq: Four times a day (QID) | RESPIRATORY_TRACT | Status: DC | PRN

## 2022-11-15 MED ORDER — VITAMIN C 500 MG PO TABS: 500.0000 mg | ORAL_TABLET | Freq: Every day | ORAL | Status: DC

## 2022-11-15 MED ORDER — SODIUM CHLORIDE 0.9% FLUSH
3.0000 mL | Freq: Two times a day (BID) | INTRAVENOUS | Status: DC
  Administered 2022-11-15 (×2): 3 mL via INTRAVENOUS

## 2022-11-15 MED ORDER — PEG-KCL-NACL-NASULF-NA ASC-C 100 G PO SOLR
0.5000 | Freq: Once | ORAL | Status: AC
  Administered 2022-11-15: 100 g via ORAL
  Filled 2022-11-15: qty 1

## 2022-11-15 MED ORDER — GUAIFENESIN-DM 100-10 MG/5ML PO SYRP: 10.0000 mL | ORAL_SOLUTION | ORAL | Status: DC | PRN

## 2022-11-15 MED ORDER — PANTOPRAZOLE INFUSION (NEW) - SIMPLE MED
8.0000 mg/h | INTRAVENOUS | Status: DC
  Administered 2022-11-15 – 2022-11-16 (×3): 8 mg/h via INTRAVENOUS
  Filled 2022-11-15 (×4): qty 100

## 2022-11-15 MED ORDER — ACETAMINOPHEN 650 MG RE SUPP: 650.0000 mg | Freq: Four times a day (QID) | RECTAL | Status: DC | PRN

## 2022-11-15 MED ORDER — LORAZEPAM 2 MG/ML IJ SOLN: 0.0000 mg | Freq: Four times a day (QID) | INTRAMUSCULAR | Status: DC

## 2022-11-15 MED ORDER — ZINC SULFATE 220 (50 ZN) MG PO CAPS: 220.0000 mg | ORAL_CAPSULE | Freq: Every day | ORAL | Status: DC

## 2022-11-15 MED ORDER — THIAMINE MONONITRATE 100 MG PO TABS
100.0000 mg | ORAL_TABLET | Freq: Every day | ORAL | Status: DC
  Administered 2022-11-15: 100 mg via ORAL
  Filled 2022-11-15: qty 1

## 2022-11-15 MED ORDER — ADULT MULTIVITAMIN W/MINERALS CH
1.0000 | ORAL_TABLET | Freq: Every day | ORAL | Status: DC
  Administered 2022-11-15: 1 via ORAL
  Filled 2022-11-15: qty 1

## 2022-11-15 MED ORDER — PEG-KCL-NACL-NASULF-NA ASC-C 100 G PO SOLR: 1.0000 | Freq: Once | ORAL | Status: DC

## 2022-11-15 MED ORDER — LORAZEPAM 1 MG PO TABS: 1.0000 mg | ORAL_TABLET | ORAL | Status: DC | PRN

## 2022-11-15 MED ORDER — SODIUM CHLORIDE 0.9 % IV SOLN: INTRAVENOUS | Status: DC

## 2022-11-15 MED ORDER — HYDRALAZINE HCL 20 MG/ML IJ SOLN: 10.0000 mg | INTRAMUSCULAR | Status: DC | PRN

## 2022-11-15 MED ORDER — THIAMINE HCL 100 MG/ML IJ SOLN: 100.0000 mg | Freq: Every day | INTRAMUSCULAR | Status: DC

## 2022-11-15 MED ORDER — ONDANSETRON HCL 4 MG/2ML IJ SOLN: 4.0000 mg | Freq: Four times a day (QID) | INTRAMUSCULAR | Status: DC | PRN

## 2022-11-15 MED ORDER — LORAZEPAM 2 MG/ML IJ SOLN: 0.0000 mg | Freq: Two times a day (BID) | INTRAMUSCULAR | Status: DC

## 2022-11-15 MED ORDER — PANTOPRAZOLE SODIUM 40 MG IV SOLR
40.0000 mg | Freq: Once | INTRAVENOUS | Status: AC
  Administered 2022-11-15: 40 mg via INTRAVENOUS
  Filled 2022-11-15: qty 10

## 2022-11-15 MED ORDER — ACETAMINOPHEN 325 MG PO TABS: 650.0000 mg | ORAL_TABLET | Freq: Four times a day (QID) | ORAL | Status: DC | PRN

## 2022-11-15 MED ORDER — CALCIUM GLUCONATE-NACL 1-0.675 GM/50ML-% IV SOLN
1.0000 g | Freq: Once | INTRAVENOUS | Status: AC
  Administered 2022-11-15: 1000 mg via INTRAVENOUS
  Filled 2022-11-15: qty 50

## 2022-11-15 MED ORDER — PANTOPRAZOLE SODIUM 40 MG IV SOLR: 40.0000 mg | Freq: Two times a day (BID) | INTRAVENOUS | Status: DC

## 2022-11-15 NOTE — Consult Note (Addendum)
Attending physician's note   I have reviewed the chart and discussed his care extensively on rounds. I performed a substantive portion of this encounter, including complete performance of at least one of the key components, in conjunction with the APP. I agree with the APP's note, impression, and recommendations with my edits.   61 year old male with medical history as outlined below, admitted earlier today with syncope, diarrhea, and hematochezia.  Was transported to ER via EMS, and noted to have hypotension in the field.  Had been feeling ill for the last week or so, and taking ibuprofen and Claritin.  Diagnosed with COVID on admission.  Admission evaluation notable for the following: - H/H 12/36 --> 11.4/33.  Baseline Hgb ~14-15 - BUN/creatinine 17/1.2 - Normal liver enzymes.  Albumin 2.9 - INR 1.1 - COVID-positive  1) Hematochezia 2) Symptomatic anemia 3) Diarrhea 4) Abdominal cramping 5) Syncope - Unable to secure spot in endoscopy unit for expedited EGD today.  Additionally, higher suspicion that he could have LGI bleed, so instead of 2 separate sedating procedures, will instead plan for EGD/colonoscopy tomorrow for diagnostic and therapeutic intent - Clears okay today with n.p.o. at midnight - Bowel prep this evening for procedures tomorrow - Continue serial CBC checks with blood products as needed per protocol - Continue IV PPI  6) COVID - Management per primary Hospitalist service - Procedures will be done towards the end of the day tomorrow per COVID protocols  7) EtOH use disorder - CIWA protocol - Aside from hypoalbuminemia, no serologic e/o impaired hepatic synthetic function  I discussed the change in endoscopy plan with the patient today, and he understands and agrees  Gerrit Heck, DO, [place] [phone] office          Consultation  Referring Provider:   Dr. Tamala Julian Primary Care Physician:  Derinda Late, MD Primary Gastroenterologist: Dr.  Carlean Purl       Reason for Consultation:   Hematochezia         HPI:   Tony Buck is a 61 y.o. male with a past medical history as listed below including depression and alcohol use, who presented to the hospital on 11/15/2022 for rectal bleeding.    At time of admission patient described that he had a syncopal episode just prior to his arrival apparently around 1230 this morning he awoke with profuse diarrhea, 3+ episodes and spent about an hour in the bathroom, he then went to walk to the bed when he became weak and fell to the EMS arrived they reported BP in the 70s and there was a large amount of bright red blood per rectum on the scene.    Today, patient is seen with his wife by his bedside, she helps with his history.  He tells me he got up around 1230 this morning with a feeling like he had to go to the bathroom and had diarrhea, apparently this took him 10 to 15 minutes to feel like he was empty and he had to return to the toilet on 2 more occasions and after the last episode was walking back to the bed and passed out.  His wife found him on the floor at around 130 this morning and he was in and out of consciousness per her, she then heard something come out of his bottom and saw blood all over the floor, enough to scare her and she called EMS.  She tells me it looked like a "horror movie", she is not exactly sure  what color it was but possibly maroon.  He has not had another bowel movement since.  He tells me he is very hungry and has not had anything to eat today.  In general he had been feeling ill over the past week and had been taking 2 ibuprofen tablets twice daily for some generalized aches and pains as well as Claritin for a runny nose which he thought was helping.  He had a decreased appetite as well.  Also has been experiencing uncontrollable heartburn, tells me that usually this is just off-and-on but over the past week he has been trying Tums which usually help and they did not.     Denies fever, chills, weight loss or previous GI bleed.  ED course: BP 94/61-112/72, hemoglobin 12.2--> given 1 L of normal saline and Protonix 40 mg IV, COVID POSITIVE  GI history: 04/08/2013 colonoscopy for screening with a sessile polyp measuring 5 mm in size in the transverse colon, moderate diverticulosis in the left colon and mild diverticulosis in the right colon; pathology showed a diminutive hyperplastic polyp removed 10 yrs  Past Medical History:  Diagnosis Date   Depression 2012   Family history of aortic aneurysm 04/29/2018   Family history of first degree relative with bicuspid aortic valve 04/29/2018    Past Surgical History:  Procedure Laterality Date   hand     Right hand surgery   HAND SURGERY  1975    Family History  Problem Relation Age of Onset   Hypertension Mother    Hyperlipidemia Father    Hypertension Father    Arthritis Father    Other Father        enlarged upper Aorta and a bicuspial heart valve   Heart disease Father    Arthritis Paternal Grandmother    Other Brother        enlarged upper Aorta and a bicuspial heart valve   Other Brother        enlarged upper Aorta and a bicuspial heart valve    Social History   Tobacco Use   Smoking status: Never   Smokeless tobacco: Never  Vaping Use   Vaping Use: Never used  Substance Use Topics   Alcohol use: Yes    Comment: 3 beers a week   Drug use: No    Prior to Admission medications   Medication Sig Start Date End Date Taking? Authorizing Provider  fexofenadine (ALLEGRA HIVES 24HR) 180 MG tablet Take 180 mg by mouth daily.   Yes [provider]  ibuprofen (ADVIL) 200 MG tablet Take 400 mg by mouth in the morning and at bedtime. For a fever the last few days   Yes [provider]  OVER THE COUNTER MEDICATION Take 4 capsules by mouth at bedtime. Nutrafol for Men - hair supplement   Yes [provider]    Current Facility-Administered Medications  Medication Dose Route  Frequency Provider Last Rate Last Admin   acetaminophen (TYLENOL) tablet 650 mg  650 mg Oral Q6H PRN Norval Morton, MD       Or   acetaminophen (TYLENOL) suppository 650 mg  650 mg Rectal Q6H PRN Fuller Plan A, MD       albuterol (PROVENTIL) (2.5 MG/3ML) 0.083% nebulizer solution 2.5 mg  2.5 mg Nebulization Q6H PRN Smith, Rondell A, MD       hydrALAZINE (APRESOLINE) injection 10 mg  10 mg Intravenous Q4H PRN Norval Morton, MD       loratadine (CLARITIN) tablet  10 mg  10 mg Oral Daily Smith, Rondell A, MD       ondansetron (ZOFRAN) tablet 4 mg  4 mg Oral Q6H PRN Fuller Plan A, MD       Or   ondansetron (ZOFRAN) injection 4 mg  4 mg Intravenous Q6H PRN Norval Morton, MD       [START ON 11/18/2022] pantoprazole (PROTONIX) injection 40 mg  40 mg Intravenous Q12H Smith, Rondell A, MD       pantoprozole (PROTONIX) 80 mg /NS 100 mL infusion  8 mg/hr Intravenous Continuous Fuller Plan A, MD 10 mL/hr at 11/15/22 1133 8 mg/hr at 11/15/22 1133   sodium chloride flush (NS) 0.9 % injection 3 mL  3 mL Intravenous Q12H Fuller Plan A, MD        Allergies as of 11/15/2022   (No Known Allergies)     Review of Systems:    Constitutional: No weight loss, fever or chills Skin: No rash  Cardiovascular: No chest pain Respiratory: No SOB  Gastrointestinal: See HPI and otherwise negative Genitourinary: No dysuria Neurological: +syncope Musculoskeletal: No new muscle or joint pain Hematologic: No bruising Psychiatric: No history of depression or anxiety    Physical Exam:  Vital signs in last 24 hours: Temp:  [98.5 F (36.9 C)-99.8 F (37.7 C)] 99.8 F (37.7 C) (12/23 1014) Pulse Rate:  [61-71] 62 (12/23 1014) Resp:  [12-20] 20 (12/23 1014) BP: (94-117)/(61-77) 117/77 (12/23 1014) SpO2:  [96 %-99 %] 98 % (12/23 0730) Weight:  [79.4 kg] 79.4 kg (12/23 0415)   General:   Pleasant Caucasian male appears to be in NAD, Well developed, Well nourished, alert and cooperative Head:   Normocephalic and atraumatic. Eyes:   PEERL, EOMI. No icterus. Conjunctiva pink. Ears:  Normal auditory acuity. Neck:  Supple Throat: Oral cavity and pharynx without inflammation, swelling or lesion.  Lungs: Respirations even and unlabored. Lungs clear to auscultation bilaterally.   No wheezes, crackles, or rhonchi.  Heart: Normal S1, S2. No MRG. Regular rate and rhythm. No peripheral edema, cyanosis or pallor.  Abdomen:  Soft, nondistended, nontender. No rebound or guarding. Increased BS all four quadrants, No appreciable masses or hepatomegaly. Rectal:  Not performed.  Msk:  Symmetrical without gross deformities. Peripheral pulses intact.  Extremities:  Without edema, no deformity or joint abnormality. Normal ROM, normal sensation. Neurologic:  Alert and  oriented x4;  grossly normal neurologically.  Skin:   Dry and intact without significant lesions or rashes. Psychiatric: Demonstrates good judgement and reason without abnormal affect or behaviors.   LAB RESULTS: Recent Labs    11/15/22 0409 11/15/22 0523 11/15/22 0951  WBC  --  6.2  --   HGB 12.2* 11.8* 11.4*  HCT 36.0* 34.8* 32.8*  PLT  --  157  --    BMET Recent Labs    11/15/22 0409 11/15/22 0523  NA 140 138  K 3.9 3.5  CL 104 107  CO2  --  22  GLUCOSE 118* 106*  BUN 17 17  CREATININE 1.20 1.04  CALCIUM  --  7.9*   LFT Recent Labs    11/15/22 0523  PROT 5.2*  ALBUMIN 2.9*  AST 15  ALT 18  ALKPHOS 40  BILITOT 0.6   PT/INR Recent Labs    11/15/22 0523  LABPROT 14.6  INR 1.1     Impression / Plan:   Impression: 1.  Hematochezia: 1-4 possible episodes of large-volume hematochezia since 12:30 this morning with the last around 1:30/2:00  AM, none since arrival to the ER, hemoglobin 12.2--> 11.4, history of increased NSAID use over the past 6 to 7 days with at least 4 ibuprofen tabs per day, also some heartburn uncontrolled with usual times and decrease in appetite; consider upper GI bleed versus lower GI  bleed 2.  Symptomatic acute blood loss anemia 3.  Hypocalcemia 4.  COVID-positive  Plan: 1.  Scheduled patient for an EGD later this afternoon with Dr. Bryan Lemma.  Did discuss risks, benefits, complications and alternatives and patient agrees to proceed. 2.  Patient will remain n.p.o. for now. 3.  Continue to monitor hemoglobin and transfuse less than 7 4.  Agree with Protonix drip at the moment 5.  Pending results from EGD may require colonoscopy tomorrow, discussed this with the patient and his wife. 6.  Supportive measures for COVID  Thank you for your kind consultation, we will continue to follow.  Lavone Nian [facility]  11/15/2022, 12:03 PM

## 2022-11-15 NOTE — ED Notes (Signed)
PT asymptomatic during orthostatics.  Pt still had blood al over him from the episode at home  Washcloths provided so pt could clean up.

## 2022-11-15 NOTE — H&P (View-Only) (Signed)
Attending physician's note   I have reviewed the chart and discussed his care extensively on rounds. I performed a substantive portion of this encounter, including complete performance of at least one of the key components, in conjunction with the APP. I agree with the APP's note, impression, and recommendations with my edits.   61 year old male with medical history as outlined below, admitted earlier today with syncope, diarrhea, and hematochezia.  Was transported to ER via EMS, and noted to have hypotension in the field.  Had been feeling ill for the last week or so, and taking ibuprofen and Claritin.  Diagnosed with COVID on admission.  Admission evaluation notable for the following: - H/H 12/36 --> 11.4/33.  Baseline Hgb ~14-15 - BUN/creatinine 17/1.2 - Normal liver enzymes.  Albumin 2.9 - INR 1.1 - COVID-positive  1) Hematochezia 2) Symptomatic anemia 3) Diarrhea 4) Abdominal cramping 5) Syncope - Unable to secure spot in endoscopy unit for expedited EGD today.  Additionally, higher suspicion that he could have LGI bleed, so instead of 2 separate sedating procedures, will instead plan for EGD/colonoscopy tomorrow for diagnostic and therapeutic intent - Clears okay today with n.p.o. at midnight - Bowel prep this evening for procedures tomorrow - Continue serial CBC checks with blood products as needed per protocol - Continue IV PPI  6) COVID - Management per primary Hospitalist service - Procedures will be done towards the end of the day tomorrow per COVID protocols  7) EtOH use disorder - CIWA protocol - Aside from hypoalbuminemia, no serologic e/o impaired hepatic synthetic function  I discussed the change in endoscopy plan with the patient today, and he understands and agrees  Gerrit Heck, DO, [place] [phone] office          Consultation  Referring Provider:   Dr. Tamala Julian Primary Care Physician:  Derinda Late, MD Primary Gastroenterologist: Dr.  Carlean Purl       Reason for Consultation:   Hematochezia         HPI:   Tony Buck is a 61 y.o. male with a past medical history as listed below including depression and alcohol use, who presented to the hospital on 11/15/2022 for rectal bleeding.    At time of admission patient described that he had a syncopal episode just prior to his arrival apparently around 1230 this morning he awoke with profuse diarrhea, 3+ episodes and spent about an hour in the bathroom, he then went to walk to the bed when he became weak and fell to the EMS arrived they reported BP in the 70s and there was a large amount of bright red blood per rectum on the scene.    Today, patient is seen with his wife by his bedside, she helps with his history.  He tells me he got up around 1230 this morning with a feeling like he had to go to the bathroom and had diarrhea, apparently this took him 10 to 15 minutes to feel like he was empty and he had to return to the toilet on 2 more occasions and after the last episode was walking back to the bed and passed out.  His wife found him on the floor at around 130 this morning and he was in and out of consciousness per her, she then heard something come out of his bottom and saw blood all over the floor, enough to scare her and she called EMS.  She tells me it looked like a "horror movie", she is not exactly sure  what color it was but possibly maroon.  He has not had another bowel movement since.  He tells me he is very hungry and has not had anything to eat today.  In general he had been feeling ill over the past week and had been taking 2 ibuprofen tablets twice daily for some generalized aches and pains as well as Claritin for a runny nose which he thought was helping.  He had a decreased appetite as well.  Also has been experiencing uncontrollable heartburn, tells me that usually this is just off-and-on but over the past week he has been trying Tums which usually help and they did not.     Denies fever, chills, weight loss or previous GI bleed.  ED course: BP 94/61-112/72, hemoglobin 12.2--> given 1 L of normal saline and Protonix 40 mg IV, COVID POSITIVE  GI history: 04/08/2013 colonoscopy for screening with a sessile polyp measuring 5 mm in size in the transverse colon, moderate diverticulosis in the left colon and mild diverticulosis in the right colon; pathology showed a diminutive hyperplastic polyp removed 10 yrs  Past Medical History:  Diagnosis Date   Depression 2012   Family history of aortic aneurysm 04/29/2018   Family history of first degree relative with bicuspid aortic valve 04/29/2018    Past Surgical History:  Procedure Laterality Date   hand     Right hand surgery   HAND SURGERY  1975    Family History  Problem Relation Age of Onset   Hypertension Mother    Hyperlipidemia Father    Hypertension Father    Arthritis Father    Other Father        enlarged upper Aorta and a bicuspial heart valve   Heart disease Father    Arthritis Paternal Grandmother    Other Brother        enlarged upper Aorta and a bicuspial heart valve   Other Brother        enlarged upper Aorta and a bicuspial heart valve    Social History   Tobacco Use   Smoking status: Never   Smokeless tobacco: Never  Vaping Use   Vaping Use: Never used  Substance Use Topics   Alcohol use: Yes    Comment: 3 beers a week   Drug use: No    Prior to Admission medications   Medication Sig Start Date End Date Taking? Authorizing Provider  fexofenadine (ALLEGRA HIVES 24HR) 180 MG tablet Take 180 mg by mouth daily.   Yes [provider]  ibuprofen (ADVIL) 200 MG tablet Take 400 mg by mouth in the morning and at bedtime. For a fever the last few days   Yes [provider]  OVER THE COUNTER MEDICATION Take 4 capsules by mouth at bedtime. Nutrafol for Men - hair supplement   Yes [provider]    Current Facility-Administered Medications  Medication Dose Route  Frequency Provider Last Rate Last Admin   acetaminophen (TYLENOL) tablet 650 mg  650 mg Oral Q6H PRN Norval Morton, MD       Or   acetaminophen (TYLENOL) suppository 650 mg  650 mg Rectal Q6H PRN Fuller Plan A, MD       albuterol (PROVENTIL) (2.5 MG/3ML) 0.083% nebulizer solution 2.5 mg  2.5 mg Nebulization Q6H PRN Smith, Rondell A, MD       hydrALAZINE (APRESOLINE) injection 10 mg  10 mg Intravenous Q4H PRN Norval Morton, MD       loratadine (CLARITIN) tablet  10 mg  10 mg Oral Daily Smith, Rondell A, MD       ondansetron (ZOFRAN) tablet 4 mg  4 mg Oral Q6H PRN Fuller Plan A, MD       Or   ondansetron (ZOFRAN) injection 4 mg  4 mg Intravenous Q6H PRN Norval Morton, MD       [START ON 11/18/2022] pantoprazole (PROTONIX) injection 40 mg  40 mg Intravenous Q12H Smith, Rondell A, MD       pantoprozole (PROTONIX) 80 mg /NS 100 mL infusion  8 mg/hr Intravenous Continuous Fuller Plan A, MD 10 mL/hr at 11/15/22 1133 8 mg/hr at 11/15/22 1133   sodium chloride flush (NS) 0.9 % injection 3 mL  3 mL Intravenous Q12H Fuller Plan A, MD        Allergies as of 11/15/2022   (No Known Allergies)     Review of Systems:    Constitutional: No weight loss, fever or chills Skin: No rash  Cardiovascular: No chest pain Respiratory: No SOB  Gastrointestinal: See HPI and otherwise negative Genitourinary: No dysuria Neurological: +syncope Musculoskeletal: No new muscle or joint pain Hematologic: No bruising Psychiatric: No history of depression or anxiety    Physical Exam:  Vital signs in last 24 hours: Temp:  [98.5 F (36.9 C)-99.8 F (37.7 C)] 99.8 F (37.7 C) (12/23 1014) Pulse Rate:  [61-71] 62 (12/23 1014) Resp:  [12-20] 20 (12/23 1014) BP: (94-117)/(61-77) 117/77 (12/23 1014) SpO2:  [96 %-99 %] 98 % (12/23 0730) Weight:  [79.4 kg] 79.4 kg (12/23 0415)   General:   Pleasant Caucasian male appears to be in NAD, Well developed, Well nourished, alert and cooperative Head:   Normocephalic and atraumatic. Eyes:   PEERL, EOMI. No icterus. Conjunctiva pink. Ears:  Normal auditory acuity. Neck:  Supple Throat: Oral cavity and pharynx without inflammation, swelling or lesion.  Lungs: Respirations even and unlabored. Lungs clear to auscultation bilaterally.   No wheezes, crackles, or rhonchi.  Heart: Normal S1, S2. No MRG. Regular rate and rhythm. No peripheral edema, cyanosis or pallor.  Abdomen:  Soft, nondistended, nontender. No rebound or guarding. Increased BS all four quadrants, No appreciable masses or hepatomegaly. Rectal:  Not performed.  Msk:  Symmetrical without gross deformities. Peripheral pulses intact.  Extremities:  Without edema, no deformity or joint abnormality. Normal ROM, normal sensation. Neurologic:  Alert and  oriented x4;  grossly normal neurologically.  Skin:   Dry and intact without significant lesions or rashes. Psychiatric: Demonstrates good judgement and reason without abnormal affect or behaviors.   LAB RESULTS: Recent Labs    11/15/22 0409 11/15/22 0523 11/15/22 0951  WBC  --  6.2  --   HGB 12.2* 11.8* 11.4*  HCT 36.0* 34.8* 32.8*  PLT  --  157  --    BMET Recent Labs    11/15/22 0409 11/15/22 0523  NA 140 138  K 3.9 3.5  CL 104 107  CO2  --  22  GLUCOSE 118* 106*  BUN 17 17  CREATININE 1.20 1.04  CALCIUM  --  7.9*   LFT Recent Labs    11/15/22 0523  PROT 5.2*  ALBUMIN 2.9*  AST 15  ALT 18  ALKPHOS 40  BILITOT 0.6   PT/INR Recent Labs    11/15/22 0523  LABPROT 14.6  INR 1.1     Impression / Plan:   Impression: 1.  Hematochezia: 1-4 possible episodes of large-volume hematochezia since 12:30 this morning with the last around 1:30/2:00  AM, none since arrival to the ER, hemoglobin 12.2--> 11.4, history of increased NSAID use over the past 6 to 7 days with at least 4 ibuprofen tabs per day, also some heartburn uncontrolled with usual times and decrease in appetite; consider upper GI bleed versus lower GI  bleed 2.  Symptomatic acute blood loss anemia 3.  Hypocalcemia 4.  COVID-positive  Plan: 1.  Scheduled patient for an EGD later this afternoon with Dr. Bryan Lemma.  Did discuss risks, benefits, complications and alternatives and patient agrees to proceed. 2.  Patient will remain n.p.o. for now. 3.  Continue to monitor hemoglobin and transfuse less than 7 4.  Agree with Protonix drip at the moment 5.  Pending results from EGD may require colonoscopy tomorrow, discussed this with the patient and his wife. 6.  Supportive measures for COVID  Thank you for your kind consultation, we will continue to follow.  Lavone Nian [facility]  11/15/2022, 12:03 PM

## 2022-11-15 NOTE — ED Triage Notes (Signed)
Pt reports sudden onset of rectal bleeding, pt now weak, dizzy and pale. Pt reports over 3 large bloody stools.

## 2022-11-15 NOTE — ED Provider Notes (Signed)
[facility] EMERGENCY DEPARTMENT Provider Note   CSN: [id number] Arrival date & time: 11/15/22  0256     History  No chief complaint on file.   Tony Buck is a 61 y.o. male.  The history is provided by the patient and the EMS personnel.  Tony Buck is a 61 y.o. male who presents to the Emergency Department complaining of syncope.  He presents to the ED by EMS following a syncopal episode that occurred just prior to arrival.  He states that around 1230 this morning he awoke with profuse diarrhea, 3+ episodes and spent about an hour in the bathroom.  He then went to walk to the bed when he became weak and fell to the floor with a syncopal episode.  EMS reports BP in the 70s on their arrival.  There was a large amount of BRBPR per EMS on scene.  No head injury, chest pain, abdominal pain, N/V.  No prior similar sxs.  Has no known medical problems and takes no medications.  Had a normal colonoscopy 10 years ago.   He drinks about 1 bottle of wine daily, has not had a drink in a little over a week.  He has experienced some upper abdominal discomfort earlier this week and he did take some aspirin at that point, but does not take aspirin routinely.  No history of prior GI bleed.    Home Medications Prior to Admission medications   Not on File      Allergies    Patient has no known allergies.    Review of Systems   Review of Systems  All other systems reviewed and are negative.   Physical Exam Updated Vital Signs BP 94/61 (BP Location: Right Arm)   Pulse 62   Temp 98.6 F (37 C) (Oral)   Resp 16   SpO2 99%  Physical Exam Vitals and nursing note reviewed.  Constitutional:      Appearance: He is well-developed.  HENT:     Head: Normocephalic and atraumatic.  Cardiovascular:     Rate and Rhythm: Normal rate and regular rhythm.     Heart sounds: No murmur heard. Pulmonary:     Effort: Pulmonary effort is normal. No respiratory distress.      Breath sounds: Normal breath sounds.  Abdominal:     Palpations: Abdomen is soft.     Tenderness: There is no abdominal tenderness. There is no guarding or rebound.  Genitourinary:    Comments: Large amount of dark red blood surrounding the anus Musculoskeletal:        General: No swelling or tenderness.  Skin:    General: Skin is warm and dry.  Neurological:     Mental Status: He is alert and oriented to person, place, and time.  Psychiatric:        Behavior: Behavior normal.     ED Results / Procedures / Treatments   Labs (all labs ordered are listed, but only abnormal results are displayed) Labs Reviewed  COMPREHENSIVE METABOLIC PANEL  CBC WITH DIFFERENTIAL/PLATELET  PROTIME-INR  TYPE AND SCREEN    EKG None  Radiology No results found.  Procedures Procedures    Medications Ordered in ED Medications - No data to display  ED Course/ Medical Decision Making/ A&P                           Medical Decision Making Amount and/or Complexity of Data Reviewed Labs: ordered.  Risk Prescription drug management. Decision regarding hospitalization.   Patient with no significant medical history here for evaluation of hematochezia.  He did have a syncopal event in the setting of large volume bloody bowel movements.  His hypotension did resolve prior to IV fluid administration.  He is anemic on labs.  He was given a dose of Protonix for possible brisk upper GI bleed.  No stigmata of cirrhosis, variceal bleed felt to be unlikely at this point.  Patient updated of findings of studies.  Medicine consulted for admission for ongoing care.       Final Clinical Impression(s) / ED Diagnoses Final diagnoses:  Acute GI bleeding    Rx / DC Orders ED Discharge Orders     None         Quintella Reichert, MD 11/15/22 2239

## 2022-11-15 NOTE — H&P (Addendum)
History and Physical    Patient: Tony Buck JJK:[id number] DOB: Sep 04, 1961 DOA: 11/15/2022 DOS: the patient was seen and examined on 11/15/2022 PCP: Derinda Late, MD  Patient coming from: Home  Chief Complaint:  Chief Complaint  Patient presents with   Rectal Bleeding   HPI: Tony Buck is a 61 y.o. male with medical history significant of depression and alcohol use who presents after having diarrhea and passed out at home.  He will get up this morning around 12:30 an and had profuse diarrhea with reports of abdominal cramping.  He noted having at least 3 bowel movements for which had been in the bathroom for at least an hour.  When he had gotten up to leave the bathroom he thinks he passed out and fell.  His wife heard him fall and came to the bathroom to check on him and noted that he seemed to be going in and out of consciousness.  There was blood present maroon-colored.  Over the last week he had not been feeling well with reports of nasal congestion, cough, and myalgias.  He had been taking Claritin and 2 ibuprofens twice daily to treat symptoms and has been feeling better recently until this morning.  Associated symptoms included recent episodes of heartburn and tenderness.  Denies having any fever, chills, shortness of breath, chest pain, or dysuria.  He has not had another bowel movement since this morning.  Patient does admit to drinking a bottle of wine per day on average.  Patient's last colonoscopy was in 03/2013 by Dr. Carlean Purl noted sessile polyp measuring 5 mm in the transverse colon that was removed, moderate diverticulosis in the left colon, mild diverticulosis was noted in the right colon.  He is reportedly due for repeat.  It was reported that he drinks 1 bottle of wine per day on average and has done so for at least 5 years.  In route with EMS patient's initial blood pressure was documented in the 70s while sitting, but improved after laying him flat to lowest blood  pressure noted to be 94/57 with heart rates in the 50-60s.  There is report of bright red blood present on the floor with EMS.  In the emergency department patient was noted to be afebrile with blood pressures 94/61 -112/72 and all other vital signs maintained.  Labs significant hemoglobin 12.2-> 11.8, calcium 7.9, and ionized calcium 1.08.  Patient was typed and screened for blood products.  Patient was given 1 L of normal saline IV fluids and Protonix 40 mg IV.  Review of Systems: As mentioned in the history of present illness. All other systems reviewed and are negative. Past Medical History:  Diagnosis Date   Depression 2012   Family history of aortic aneurysm 04/29/2018   Family history of first degree relative with bicuspid aortic valve 04/29/2018   Past Surgical History:  Procedure Laterality Date   hand     Right hand surgery   HAND SURGERY  1975   Social History:  reports that he has never smoked. He has never used smokeless tobacco. He reports current alcohol use. He reports that he does not use drugs.  No Known Allergies  Family History  Problem Relation Age of Onset   Hypertension Mother    Hyperlipidemia Father    Hypertension Father    Arthritis Father    Other Father        enlarged upper Aorta and a bicuspial heart valve   Heart disease Father    Arthritis  Paternal Grandmother    Other Brother        enlarged upper Aorta and a bicuspial heart valve   Other Brother        enlarged upper Aorta and a bicuspial heart valve    Prior to Admission medications   Medication Sig Start Date End Date Taking? Authorizing Provider  fexofenadine (ALLEGRA HIVES 24HR) 180 MG tablet Take 180 mg by mouth daily.   Yes [provider]  ibuprofen (ADVIL) 200 MG tablet Take 400 mg by mouth in the morning and at bedtime. For a fever the last few days   Yes [provider]  OVER THE COUNTER MEDICATION Take 4 capsules by mouth at bedtime. Nutrafol for Men - hair  supplement   Yes [provider]    Physical Exam: Vitals:   11/15/22 0600 11/15/22 0615 11/15/22 0630 11/15/22 0700  BP: 107/66 109/72 112/72 110/68  Pulse: 66 64 71 71  Resp: '14 14 15 14  '$ Temp:      TempSrc:      SpO2: 96% 97% 96% 98%  Weight:      Height:       Constitutional: Older adult male currently in no acute distress Eyes: PERRL, lids and conjunctivae normal ENMT: Mucous membranes are moist.   Neck: normal, supple  Respiratory: clear to auscultation bilaterally, no wheezing, no crackles. Normal respiratory effort.   Cardiovascular: Regular rate and rhythm, no murmurs / rubs / gallops. No extremity edema.   Abdomen: Mild  tenderness to palpation of the left lower quadrant of the abdomen. Musculoskeletal: no clubbing / cyanosis. No joint deformity upper and lower extremities. Good ROM, no contractures. Normal muscle tone.  Skin: no rashes, lesions, ulcers. No induration Neurologic: CN 2-12 grossly intact.   Strength 5/5 in all 4.  Psychiatric: Normal judgment and insight. Alert and oriented x 3. Normal mood.   Data Reviewed:  Reviewed labs, imaging and pertinent records as noted above in HPI. Assessment and Plan:  Symptomatic anemia secondary to GI bleed Syncope Patient presented with complaints of bloody diarrhea after having syncopal episodes.  Hemoglobin initially hemoglobin 12.2 ->11.8->11.4 g/dL. Risk factors include patient recently taking ibuprofen twice daily last week.  Patient with prior colonoscopy back in 2014 with Dr. Carlean Purl noted 1 sessile polyp which was removed and mild to moderate diverticulosis.  Question possibility of upper GI bleed given NSAID use.  Patient has been given 1 mg.  His IV -Admit to a telemetry bed -N.p.o. for possible need of procedure -Serial monitoring of H&H -Protonix drip per protocol  -Normal saline IV fluids at 75 mL/h -Discussed with the patient the need to refrain from use of NSAIDs -Gastroenterology consulted,   will follow-up for any further recommendations  COVID-19 infection Patient reported having generalized malaise with nasal congestion and cough that started 1 week ago.  Screening positive for COVID-19.  Patient has been taking Claritin and ibuprofen and it felt like symptoms were improving. -Airborne and contact precautions -No indication for Paxlovid as over 5 days since onset of symptoms which appear to be improving -Symptomatic treatment as needed  Hypocalcemia Acute.  Calcium 7.9 with ionized calcium 1.08. -Give 1 g of calcium gluconate IV -Continue to monitor and replace as needed  Alcohol use Patient reports drinking bottle wine per day on average that he reports equates to approximately 4 glasses. -CIWA protocols initiated with Ativan   DVT prophylaxis: SCDs Advance Care Planning:   Code Status: Full Code    Consults:  GI Family Communication: Wife updated at bedside  Severity of Illness: The appropriate patient status for this patient is OBSERVATION. Observation status is judged to be reasonable and necessary in order to provide the required intensity of service to ensure the patient's safety. The patient's presenting symptoms, physical exam findings, and initial radiographic and laboratory data in the context of their medical condition is felt to place them at decreased risk for further clinical deterioration. Furthermore, it is anticipated that the patient will be medically stable for discharge from the hospital within 2 midnights of admission.   Author: Norval Morton, MD 11/15/2022 8:05 AM  For on call review [email or web address].

## 2022-11-15 NOTE — ED Notes (Signed)
ED TO INPATIENT HANDOFF REPORT  ED Nurse Name and Phone #: Iona Coach Name/Age/Gender Tony Buck 61 y.o. male Room/Bed: 035C/035C  Code Status   Code Status: Full Code  Home/SNF/Other Home Patient oriented to: self, place, time, and situation Is this baseline? Yes   Triage Complete: Triage complete  Chief Complaint GI bleed [K92.2]  Triage Note Pt reports sudden onset of rectal bleeding, pt now weak, dizzy and pale. Pt reports over 3 large bloody stools.    Allergies No Known Allergies  Level of Care/Admitting Diagnosis ED Disposition     ED Disposition  Admit   Condition  --   Comment  Hospital Area: [facility] [[id number]]  Level of Care: Telemetry Medical [104]  May place patient in observation at [facility] or [facility] if equivalent level of care is available:: No  Covid Evaluation: Asymptomatic - no recent exposure (last 10 days) testing not required  Diagnosis: GI bleed [601093]  Admitting Physician: Norval Morton [[id number]]  Attending Physician: Norval Morton [[id number]]          B Medical/Surgery History Past Medical History:  Diagnosis Date   Depression 2012   Family history of aortic aneurysm 04/29/2018   Family history of first degree relative with bicuspid aortic valve 04/29/2018   Past Surgical History:  Procedure Laterality Date   hand     Right hand surgery   HAND SURGERY  1975     A IV Location/Drains/Wounds Patient Lines/Drains/Airways Status     Active Line/Drains/Airways     Name Placement date Placement time Site Days   Peripheral IV 11/15/22 20 G 1" Left Hand 11/15/22  0400  Hand  less than 1            Intake/Output Last 24 hours  Intake/Output Summary (Last 24 hours) at 11/15/2022 0623 Last data filed at 11/15/2022 0556 Gross per 24 hour  Intake 1000 ml  Output --  Net 1000 ml    Labs/Imaging Results for orders placed or performed during the hospital encounter of 11/15/22 (from the  past 48 hour(s))  Type and screen [place]     Status: None   Collection Time: 11/15/22  3:06 AM  Result Value Ref Range   ABO/RH(D) A POS    Antibody Screen NEG    Sample Expiration      11/18/2022,2359 Performed at [facility], [place] [address], [place], [place] [address]   I-stat chem 8, ED     Status: Abnormal   Collection Time: 11/15/22  4:09 AM  Result Value Ref Range   Sodium 140 135 - 145 mmol/L   Potassium 3.9 3.5 - 5.1 mmol/L   Chloride 104 98 - 111 mmol/L   BUN 17 6 - 20 mg/dL   Creatinine, Ser 1.20 0.61 - 1.24 mg/dL   Glucose, Bld 118 (H) 70 - 99 mg/dL    Comment: Glucose reference range applies only to samples taken after fasting for at least 8 hours.   Calcium, Ion 1.08 (L) 1.15 - 1.40 mmol/L   TCO2 25 22 - 32 mmol/L   Hemoglobin 12.2 (L) 13.0 - 17.0 g/dL   HCT 36.0 (L) 39.0 - 52.0 %  Comprehensive metabolic panel     Status: Abnormal   Collection Time: 11/15/22  5:23 AM  Result Value Ref Range   Sodium 138 135 - 145 mmol/L   Potassium 3.5 3.5 - 5.1 mmol/L   Chloride 107 98 - 111  mmol/L   CO2 22 22 - 32 mmol/L   Glucose, Bld 106 (H) 70 - 99 mg/dL    Comment: Glucose reference range applies only to samples taken after fasting for at least 8 hours.   BUN 17 6 - 20 mg/dL   Creatinine, Ser 1.04 0.61 - 1.24 mg/dL   Calcium 7.9 (L) 8.9 - 10.3 mg/dL   Total Protein 5.2 (L) 6.5 - 8.1 g/dL   Albumin 2.9 (L) 3.5 - 5.0 g/dL   AST 15 15 - 41 U/L   ALT 18 0 - 44 U/L   Alkaline Phosphatase 40 38 - 126 U/L   Total Bilirubin 0.6 0.3 - 1.2 mg/dL   GFR, Estimated >60 >60 mL/min    Comment: (NOTE) Calculated using the CKD-EPI Creatinine Equation (2021)    Anion gap 9 5 - 15    Comment: Performed at [facility], [place] [address] [place], [place] [address]  CBC with Differential     Status: Abnormal   Collection Time: 11/15/22  5:23 AM  Result Value Ref Range   WBC 6.2 4.0 - 10.5 K/uL   RBC 3.85 (L) 4.22 - 5.81 MIL/uL   Hemoglobin 11.8 (L)  13.0 - 17.0 g/dL   HCT 34.8 (L) 39.0 - 52.0 %   MCV 90.4 80.0 - 100.0 fL   MCH 30.6 26.0 - 34.0 pg   MCHC 33.9 30.0 - 36.0 g/dL   RDW 12.6 11.5 - 15.5 %   Platelets 157 150 - 400 K/uL   nRBC 0.0 0.0 - 0.2 %   Neutrophils Relative % 81 %   Neutro Abs 4.9 1.7 - 7.7 K/uL   Lymphocytes Relative 11 %   Lymphs Abs 0.7 0.7 - 4.0 K/uL   Monocytes Relative 8 %   Monocytes Absolute 0.5 0.1 - 1.0 K/uL   Eosinophils Relative 0 %   Eosinophils Absolute 0.0 0.0 - 0.5 K/uL   Basophils Relative 0 %   Basophils Absolute 0.0 0.0 - 0.1 K/uL   Immature Granulocytes 0 %   Abs Immature Granulocytes 0.02 0.00 - 0.07 K/uL    Comment: Performed at [facility] [address], [place], [place] [address]  Protime-INR     Status: None   Collection Time: 11/15/22  5:23 AM  Result Value Ref Range   Prothrombin Time 14.6 11.4 - 15.2 seconds   INR 1.1 0.8 - 1.2    Comment: (NOTE) INR goal varies based on device and disease states. Performed at [facility], [place] [address] [place], [place] [address]    No results found.  Pending Labs Unresulted Labs (From admission, onward)     Start     Ordered   11/16/22 0500  CBC  Tomorrow morning,   R        11/15/22 0819   11/16/22 5361  Basic metabolic panel  Tomorrow morning,   R        11/15/22 0819   11/15/22 1000  Hemoglobin and hematocrit, blood  Now then every 6 hours,   R (with TIMED occurrences)      11/15/22 0819   11/15/22 0825  Resp panel by RT-PCR (RSV, Flu A&B, Covid) Anterior Nasal Swab  (Tier 2 - SymptomaticResp panel by RT-PCR (RSV, Flu A&B, Covid))  Once,   R        11/15/22 0824   11/15/22 0814  HIV Antibody (routine testing w rflx)  (HIV Antibody (Routine testing w reflex) panel)  Once,   R        11/15/22 0819   11/15/22 0330  ABO/Rh  Once,   R        11/15/22 0330            Vitals/Pain Today's Vitals   11/15/22 0630 11/15/22 0700 11/15/22 0730 11/15/22 0840  BP: 112/72 110/68 115/69   Pulse: 71 71 66   Resp: '15  14 13   '$ Temp:    98.5 F (36.9 C)  TempSrc:    Oral  SpO2: 96% 98% 98%   Weight:      Height:      PainSc:        Isolation Precautions Airborne and Contact precautions  Medications Medications  sodium chloride flush (NS) 0.9 % injection 3 mL (has no administration in time range)  calcium gluconate 1 g/ 50 mL sodium chloride IVPB (has no administration in time range)  acetaminophen (TYLENOL) tablet 650 mg (has no administration in time range)    Or  acetaminophen (TYLENOL) suppository 650 mg (has no administration in time range)  ondansetron (ZOFRAN) tablet 4 mg (has no administration in time range)    Or  ondansetron (ZOFRAN) injection 4 mg (has no administration in time range)  albuterol (PROVENTIL) (2.5 MG/3ML) 0.083% nebulizer solution 2.5 mg (has no administration in time range)  hydrALAZINE (APRESOLINE) injection 10 mg (has no administration in time range)  loratadine (CLARITIN) tablet 10 mg (has no administration in time range)  pantoprozole (PROTONIX) 80 mg /NS 100 mL infusion (has no administration in time range)  pantoprazole (PROTONIX) injection 40 mg (has no administration in time range)  sodium chloride 0.9 % bolus 1,000 mL (0 mLs Intravenous Stopped 11/15/22 0556)  pantoprazole (PROTONIX) injection 40 mg (40 mg Intravenous Given 11/15/22 0727)    Mobility walks Low fall risk   Focused Assessments     R Recommendations: See Admitting Provider Note  Report given to:   Additional Notes:

## 2022-11-15 NOTE — Plan of Care (Signed)
  Problem: Respiratory: Goal: Complications related to the disease process, condition or treatment will be avoided or minimized Outcome: Progressing   

## 2022-11-16 ENCOUNTER — Encounter (HOSPITAL_COMMUNITY): Payer: Self-pay | Admitting: Internal Medicine

## 2022-11-16 ENCOUNTER — Encounter (HOSPITAL_COMMUNITY)
Admission: EM | Disposition: A | Discharge status: Home / Self Care | Payer: Self-pay | Source: Home / Self Care | Attending: Emergency Medicine

## 2022-11-16 ENCOUNTER — Observation Stay (HOSPITAL_COMMUNITY): Payer: 59 | Admitting: Anesthesiology

## 2022-11-16 ENCOUNTER — Observation Stay (HOSPITAL_BASED_OUTPATIENT_CLINIC_OR_DEPARTMENT_OTHER): Payer: 59 | Admitting: Anesthesiology

## 2022-11-16 DIAGNOSIS — K921 Melena: Secondary | ICD-10-CM | POA: Diagnosis not present

## 2022-11-16 DIAGNOSIS — D62 Acute posthemorrhagic anemia: Secondary | ICD-10-CM

## 2022-11-16 DIAGNOSIS — F32A Depression, unspecified: Secondary | ICD-10-CM

## 2022-11-16 DIAGNOSIS — U071 COVID-19: Secondary | ICD-10-CM | POA: Diagnosis not present

## 2022-11-16 DIAGNOSIS — K5731 Diverticulosis of large intestine without perforation or abscess with bleeding: Secondary | ICD-10-CM | POA: Diagnosis not present

## 2022-11-16 DIAGNOSIS — K5791 Diverticulosis of intestine, part unspecified, without perforation or abscess with bleeding: Secondary | ICD-10-CM

## 2022-11-16 DIAGNOSIS — K2100 Gastro-esophageal reflux disease with esophagitis, without bleeding: Secondary | ICD-10-CM

## 2022-11-16 DIAGNOSIS — K5730 Diverticulosis of large intestine without perforation or abscess without bleeding: Secondary | ICD-10-CM | POA: Diagnosis not present

## 2022-11-16 DIAGNOSIS — Z789 Other specified health status: Secondary | ICD-10-CM | POA: Diagnosis not present

## 2022-11-16 HISTORY — PX: COLONOSCOPY: SHX5424

## 2022-11-16 HISTORY — PX: ESOPHAGOGASTRODUODENOSCOPY (EGD) WITH PROPOFOL: SHX5813

## 2022-11-16 LAB — CBC
HCT: 32.1 % — ABNORMAL LOW (ref 39.0–52.0)
Hemoglobin: 10.8 g/dL — ABNORMAL LOW (ref 13.0–17.0)
MCH: 30.3 pg (ref 26.0–34.0)
MCHC: 33.6 g/dL (ref 30.0–36.0)
MCV: 90.2 fL (ref 80.0–100.0)
Platelets: 158 10*3/uL (ref 150–400)
RBC: 3.56 MIL/uL — ABNORMAL LOW (ref 4.22–5.81)
RDW: 12.5 % (ref 11.5–15.5)
WBC: 4.4 10*3/uL (ref 4.0–10.5)
nRBC: 0 % (ref 0.0–0.2)

## 2022-11-16 LAB — BASIC METABOLIC PANEL
Anion gap: 10 (ref 5–15)
BUN: 9 mg/dL (ref 6–20)
CO2: 25 mmol/L (ref 22–32)
Calcium: 8.5 mg/dL — ABNORMAL LOW (ref 8.9–10.3)
Chloride: 104 mmol/L (ref 98–111)
Creatinine, Ser: 1.08 mg/dL (ref 0.61–1.24)
GFR, Estimated: >60 mL/min (ref >60)
Glucose, Bld: 101 mg/dL — ABNORMAL HIGH (ref 70–99)
Potassium: 3.6 mmol/L (ref 3.5–5.1)
Sodium: 139 mmol/L (ref 135–145)

## 2022-11-16 LAB — MAGNESIUM: Magnesium: 1.8 mg/dL (ref 1.7–2.4)

## 2022-11-16 SURGERY — ESOPHAGOGASTRODUODENOSCOPY (EGD) WITH PROPOFOL
Anesthesia: Monitor Anesthesia Care

## 2022-11-16 MED ORDER — PROPOFOL 10 MG/ML IV BOLUS
INTRAVENOUS | Status: DC | PRN
  Administered 2022-11-16: 50 mg via INTRAVENOUS

## 2022-11-16 MED ORDER — LACTATED RINGERS IV SOLN: INTRAVENOUS | Status: DC | PRN

## 2022-11-16 MED ORDER — PROPOFOL 500 MG/50ML IV EMUL
INTRAVENOUS | Status: DC | PRN
  Administered 2022-11-16: 150 ug/kg/min via INTRAVENOUS

## 2022-11-16 MED ORDER — ACETAMINOPHEN 325 MG PO TABS: 650.0000 mg | ORAL_TABLET | Freq: Four times a day (QID) | ORAL | Status: AC | PRN

## 2022-11-16 MED ORDER — PANTOPRAZOLE SODIUM 40 MG PO TBEC: 40.0000 mg | DELAYED_RELEASE_TABLET | Freq: Every day | ORAL | 0 refills | Status: AC

## 2022-11-16 MED ORDER — PANTOPRAZOLE SODIUM 40 MG PO TBEC
40.0000 mg | DELAYED_RELEASE_TABLET | Freq: Every day | ORAL | Status: DC
  Filled 2022-11-16: qty 1

## 2022-11-16 MED ORDER — LORAZEPAM 1 MG PO TABS: 1.0000 mg | ORAL_TABLET | ORAL | Status: DC | PRN

## 2022-11-16 MED ORDER — LIDOCAINE 2% (20 MG/ML) 5 ML SYRINGE
INTRAMUSCULAR | Status: DC | PRN
  Administered 2022-11-16: 100 mg via INTRAVENOUS

## 2022-11-16 SURGICAL SUPPLY — 15 items

## 2022-11-16 NOTE — Progress Notes (Addendum)
  Progress Note   Patient: Tony Buck UDJ:[id number] DOB: 08/30/1961 DOA: 11/15/2022     0 DOS: the patient was seen and examined on 11/16/2022   Brief hospital course: Tony Buck was admitted to the hospital with the working diagnosis of acute blood loss anemia due lower GI bleed.   61 yo male with the past medical history of alcohol abuse and depression who presented with diarrhea. Acute onset of severe diarrhea, with hematochezia. He had a syncope after getting up from the commode and leaving the bathroom. Reported on week of nasal congestion, cough and myalgias. EMS was called and he was found hypotensive with systolic blood pressure in the 70's. In the ED his blood pressure was 94/61, HR 71, RR 14 and 02 saturation 96%, lungs with no wheezing or rales, heart with S1 and S2 present and rhythmic, abdomen with mild tenderness to palpation at the left lower quadrant, and no lower extremity edema.   Na 138, K 3.5 CL 107 bicarbonate 22. Glucose 106 bun 17 cr 1,0 AST 15 ALT 18  Wbc 6.2 hgb 11.8, hct 34 plt 157  Sars covid 19 positive Influenza A and B negative   EKG 62 bpm, normal axis, normal intervals, sinus rhythm with no significant ST segment or T wave changes.   Patient was placed on IV pantoprazole and IV fluids. Consulted GI and plan for endoscopic procedure.      Assessment and Plan: * GI bleed Acute blood loss anemia due to acute lower GI bleed.  Follow up hgb is 10.8 No further hematochezia during his hospitalization.   Follow up on EGD and colonoscopy today. Continue close follow up on Hgb and hct No current indication for PRBC transfusion Check iron stores.  Continue with IV pantoprazole drip per protocol.   COVID-19 virus infection Incidental finding.  Possible viral gastroenteritis.  Continue supportive medical care. No current indication for systemic steroids or antiviral therapy.   Alcohol use No clinical signs of acute withdrawal. Continue neuro  checks per unit protocol.  As needed lorazepam.         Subjective: patient with no chest pain or dyspnea, no further hematochezia or melena, no nausea or vomiting   Physical Exam: Vitals:   11/15/22 1726 11/15/22 2125 11/16/22 0605 11/16/22 0942  BP: 133/81 115/77 121/80 115/74  Pulse: 71 68 (!) 58 65  Resp:  '18 18 17  '$ Temp: 99.1 F (37.3 C) 98.9 F (37.2 C) 97.7 F (36.5 C) 98.7 F (37.1 C)  TempSrc: Oral Oral Oral Oral  SpO2: 100% 98% 100% 100%  Weight:      Height:       Neurology awake and alert ENT with mild pallor Cardiovascular with S1 and S2 present and rhythmic Respiratory with no rales or wheezing Abdomen with no distention  No lower extremity edema  Data Reviewed:    Family Communication: I spoke with patient's wife at the bedside, we talked in detail about patient's condition, plan of care and prognosis and all questions were addressed.   Disposition: Status is: Observation The patient remains OBS appropriate and will d/c before 2 midnights.  Planned Discharge Destination: Home   Author: Tawni Millers, MD 11/16/2022 2:33 PM  For on call review [email or web address].

## 2022-11-16 NOTE — Op Note (Signed)
[facility] Patient Name: Tony Buck Procedure Date : 11/16/2022 MRN: [id number] Attending MD: Gerrit Heck , MD, [id number] Date of Birth: 06-07-1961 CSN: [id number] Age: 61 Admit Type: Outpatient Procedure:                Colonoscopy Indications:              Hematochezia, Acute post hemorrhagic anemia Providers:                Gerrit Heck, MD, Doristine Johns, RN, Benetta Spar, Technician Referring MD:              Medicines:                Monitored Anesthesia Care Complications:            No immediate complications. Estimated Blood Loss:     Estimated blood loss: none. Procedure:                Pre-Anesthesia Assessment:                           - Prior to the procedure, a History and Physical                            was performed, and patient medications and                            allergies were reviewed. The patient's tolerance of                            previous anesthesia was also reviewed. The risks                            and benefits of the procedure and the sedation                            options and risks were discussed with the patient.                            All questions were answered, and informed consent                            was obtained. Prior Anticoagulants: The patient has                            taken no anticoagulant or antiplatelet agents. ASA                            Grade Assessment: III - A patient with severe                            systemic disease. After reviewing the risks and  benefits, the patient was deemed in satisfactory                            condition to undergo the procedure.                           After obtaining informed consent, the colonoscope                            was passed under direct vision. Throughout the                            procedure, the patient's blood pressure, pulse, and                             oxygen saturations were monitored continuously. The                            CF-HQ190L ([id number] Olympus colonoscope was                            introduced through the anus and advanced to the the                            terminal ileum. The colonoscopy was performed                            without difficulty. The patient tolerated the                            procedure well. The quality of the bowel                            preparation was good. The terminal ileum, ileocecal                            valve, appendiceal orifice, and rectum were                            photographed. Scope In: 3:57:34 PM Scope Out: 4:12:38 PM Scope Withdrawal Time: 0 hours 10 minutes 45 seconds  Total Procedure Duration: 0 hours 15 minutes 4 seconds  Findings:      The perianal and digital rectal examinations were normal.      Multiple large-mouthed and small-mouthed diverticula were found in the       entire colon. Lavage of each of the diverticula was performed using       copious amounts of tap water, resulting in clearance with good       visualization. No stigmata of bleeding.      The exam was otherwise normal throughout the remainder of the colon. No       blood noted throughout the colon lumen. No areas of bleeding nor       stigmata of recent bleeding noted. No areas of ulceration, erosions,       erythema.      The retroflexed  view of the distal rectum and anal verge was normal and       showed no anal or rectal abnormalities.      The terminal ileum appeared normal. Impression:               - Diverticulosis in the entire examined colon.                           - The distal rectum and anal verge are normal on                            retroflexion view.                           - The examined portion of the ileum was normal.                           - No specimens collected. Recommendation:           - Return patient to hospital ward for ongoing care.                            - Advance diet as tolerated.                           - Continue present medications as prescribed.                           - Continue CBC trend while inpatient, then repeat                            CBC as outpatient 7-10 days after discharge to                            ensure returning to baseline.                           - If recurrent bleeding, recommend CTA.                           - Inpatient GI service will sign off at this time.                            Please do not hesitate to contact us with                            additional questions or concerns. Procedure Code(s):        --- Professional ---                           [phone], Colonoscopy, flexible; diagnostic, including                            collection of specimen(s) by brushing or washing,  when performed (separate procedure) Diagnosis Code(s):        --- Professional ---                           K92.1, Melena (includes Hematochezia)                           D62, Acute posthemorrhagic anemia                           K57.30, Diverticulosis of large intestine without                            perforation or abscess without bleeding CPT copyright 2022 American Medical Association. All rights reserved. The codes documented in this report are preliminary and upon coder review may  be revised to meet current compliance requirements. Gerrit Heck, MD 11/16/2022 4:24:33 PM Number of Addenda: 0

## 2022-11-16 NOTE — Anesthesia Preprocedure Evaluation (Addendum)
Anesthesia Evaluation  Patient identified by MRN, date of birth, ID band Patient awake    Reviewed: Allergy & Precautions, NPO status , Patient's Chart, lab work & pertinent test results  History of Anesthesia Complications Negative for: history of anesthetic complications  Airway Mallampati: II  TM Distance: >3 FB Neck ROM: Full    Dental no notable dental hx. (+) Dental Advisory Given   Pulmonary Recent URI  Covid infection   Pulmonary exam normal        Cardiovascular negative cardio ROS Normal cardiovascular exam     Neuro/Psych  PSYCHIATRIC DISORDERS  Depression    negative neurological ROS     GI/Hepatic ,,,(+)     substance abuse  alcohol use  Endo/Other  negative endocrine ROS    Renal/GU negative Renal ROS     Musculoskeletal negative musculoskeletal ROS (+)    Abdominal   Peds  Hematology  (+) Blood dyscrasia, anemia   Anesthesia Other Findings   Reproductive/Obstetrics                             Anesthesia Physical Anesthesia Plan  ASA: 3  Anesthesia Plan:    Post-op Pain Management: Minimal or no pain anticipated   Induction: Intravenous  PONV Risk Score and Plan: 2 and Ondansetron and Dexamethasone  Airway Management Planned:   Additional Equipment:   Intra-op Plan:   Post-operative Plan: Extubation in OR  Informed Consent: I have reviewed the patients History and Physical, chart, labs and discussed the procedure including the risks, benefits and alternatives for the proposed anesthesia with the patient or authorized representative who has indicated his/her understanding and acceptance.     Dental advisory given  Plan Discussed with: CRNA and Anesthesiologist  Anesthesia Plan Comments:        Anesthesia Quick Evaluation

## 2022-11-16 NOTE — Assessment & Plan Note (Addendum)
Acute blood loss anemia due to acute lower GI bleed.  Follow up hgb is 10.8 No further hematochezia during his hospitalization.   Follow up on EGD and colonoscopy today. EGD with low grade A esophagitis.  Normal stomach and normal duodenum.  Colonoscopy with diverticulosis in the entire examined colon.  Examined portion of ileum normal.  Continue with IV pantoprazole drip per protocol.

## 2022-11-16 NOTE — Progress Notes (Signed)
Pt was done with EGD/colonoscopy. MD was informed and will DC pt after tolerating a regular meal. AVS was given and explained to pt and wife, all questions were answered. 2 tabs of Pantoprazole was given to pt incase not able to get medication from pharmacy due to holiday.

## 2022-11-16 NOTE — Hospital Course (Addendum)
Tony Buck was admitted to the hospital with the working diagnosis of acute blood loss anemia due lower GI bleed.   62 yo male with the past medical history of alcohol abuse and depression who presented with diarrhea. Acute onset of severe diarrhea, with hematochezia. He had a syncope after getting up from the commode and leaving the bathroom. Reported on week of nasal congestion, cough and myalgias. EMS was called and he was found hypotensive with systolic blood pressure in the 70's. In the ED his blood pressure was 94/61, HR 71, RR 14 and 02 saturation 96%, lungs with no wheezing or rales, heart with S1 and S2 present and rhythmic, abdomen with mild tenderness to palpation at the left lower quadrant, and no lower extremity edema.   Na 138, K 3.5 CL 107 bicarbonate 22. Glucose 106 bun 17 cr 1,0 AST 15 ALT 18  Wbc 6.2 hgb 11.8, hct 34 plt 157  Sars covid 19 positive Influenza A and B negative   EKG 62 bpm, normal axis, normal intervals, sinus rhythm with no significant ST segment or T wave changes.   Patient was placed on IV pantoprazole and IV fluids. Consulted GI and plan for endoscopic procedure.

## 2022-11-16 NOTE — Assessment & Plan Note (Signed)
Incidental finding.  Possible viral gastroenteritis.  Continue supportive medical care. No current indication for systemic steroids or antiviral therapy.

## 2022-11-16 NOTE — Interval H&P Note (Signed)
History and Physical Interval Note:  11/16/2022 3:48 PM  Tony Buck  has presented today for surgery, with the diagnosis of GI Bleed.  The various methods of treatment have been discussed with the patient and family. After consideration of risks, benefits and other options for treatment, the patient has consented to  Procedure(s): ESOPHAGOGASTRODUODENOSCOPY (EGD) WITH PROPOFOL (N/A) COLONOSCOPY (N/A) as a surgical intervention.  The patient's history has been reviewed, patient examined, no change in status, stable for surgery.  I have reviewed the patient's chart and labs.  Questions were answered to the patient's satisfaction.     Dominic Pea Orelia Brandstetter

## 2022-11-16 NOTE — Discharge Summary (Signed)
Physician Discharge Summary   Patient: Tony Buck MRN: [id number] DOB: [birth year]  Admit date:     11/15/2022  Discharge date: 11/16/22  Discharge Physician: Jimmy Picket Rekha Hobbins   PCP: Derinda Late, MD   Recommendations at discharge:    Patient will continue pantoprazole daily for 15 days. Follow up cell count as outpatient in 7 to 10 days.  If recurrent bleeding will need CT angiography.   Discharge Diagnoses: Principal Problem:   GI bleed Active Problems:   COVID-19 virus infection   Alcohol use   Depression   Gastroesophageal reflux disease with esophagitis without hemorrhage   Acute blood loss anemia   Diverticulosis of colon without hemorrhage  Resolved Problems:   * No resolved hospital problems. [facility] Hospital Course: [name] was admitted to the hospital with the working diagnosis of acute blood loss anemia due lower GI bleed.   61 yo male with the past medical history of alcohol abuse and depression who presented with diarrhea. Acute onset of severe diarrhea, with hematochezia. He had a syncope after getting up from the commode and leaving the bathroom. Reported on week of nasal congestion, cough and myalgias. EMS was called and he was found hypotensive with systolic blood pressure in the 70's. In the ED his blood pressure was 94/61, HR 71, RR 14 and 02 saturation 96%, lungs with no wheezing or rales, heart with S1 and S2 present and rhythmic, abdomen with mild tenderness to palpation at the left lower quadrant, and no lower extremity edema.   Na 138, K 3.5 CL 107 bicarbonate 22. Glucose 106 bun 17 cr 1,0 AST 15 ALT 18  Wbc 6.2 hgb 11.8, hct 34 plt 157  Sars covid 19 positive Influenza A and B negative   EKG 62 bpm, normal axis, normal intervals, sinus rhythm with no significant ST segment or T wave changes.   Patient was placed on IV pantoprazole and IV fluids. Consulted GI and plan for endoscopic procedure.      Assessment and Plan: * GI  bleed Acute blood loss anemia due to acute lower GI bleed.  Follow up hgb is 10.8 No further hematochezia during his hospitalization.   Follow up on EGD and colonoscopy today. EGD with low grade A esophagitis.  Normal stomach and normal duodenum.  Colonoscopy with diverticulosis in the entire examined colon.  Examined portion of ileum normal.  Continue with IV pantoprazole drip per protocol.   COVID-19 virus infection Incidental finding.  Possible viral gastroenteritis.  Continue supportive medical care. No current indication for systemic steroids or antiviral therapy.   Alcohol use No clinical signs of acute withdrawal. Continue neuro checks per unit protocol.  As needed lorazepam.          Consultants: GI  Procedures performed: EGD and colonoscopy   Disposition: Home Diet recommendation:  Discharge Diet Orders (From admission, onward)     Start     Ordered   11/16/22 0000  Diet - low sodium heart healthy        11/16/22 1650           Regular diet DISCHARGE MEDICATION: Allergies as of 11/16/2022   No Known Allergies      Medication List     STOP taking these medications    ibuprofen 200 MG tablet Commonly known as: ADVIL       TAKE these medications    acetaminophen 325 MG tablet Commonly known as: TYLENOL Take 2 tablets (650 mg total) by mouth every 6 (  six) hours as needed for mild pain (or Fever >/= 101).   Allegra Hives 24HR 180 MG tablet Generic drug: fexofenadine Take 180 mg by mouth daily.   OVER THE COUNTER MEDICATION Take 4 capsules by mouth at bedtime. Nutrafol for Men - hair supplement   pantoprazole 40 MG tablet Commonly known as: PROTONIX Take 1 tablet (40 mg total) by mouth daily for 15 days.        Discharge Exam: Filed Weights   11/15/22 0415  Weight: 79.4 kg   Patient with no chest pain or dyspnea, no further hematochezia or melena, no nausea or vomiting  Neurology awake and alert ENT with mild  pallor Cardiovascular with S1 and S2 present and rhythmic Respiratory with no rales or wheezing Abdomen with no distention  No lower extremity edema  Condition at discharge: stable  The results of significant diagnostics from this hospitalization (including imaging, microbiology, ancillary and laboratory) are listed below for reference.   Imaging Studies: No results found.  Microbiology: Results for orders placed or performed during the hospital encounter of 11/15/22  Resp panel by RT-PCR (RSV, Flu A&B, Covid) Anterior Nasal Swab     Status: Abnormal   Collection Time: 11/15/22 10:08 AM   Specimen: Anterior Nasal Swab  Result Value Ref Range Status   SARS Coronavirus 2 by RT PCR POSITIVE (A) NEGATIVE Final    Comment: (NOTE) SARS-CoV-2 target nucleic acids are DETECTED.  The SARS-CoV-2 RNA is generally detectable in upper respiratory specimens during the acute phase of infection. Positive results are indicative of the presence of the identified virus, but do not rule out bacterial infection or co-infection with other pathogens not detected by the test. Clinical correlation with patient history and other diagnostic information is necessary to determine patient infection status. The expected result is Negative.  Fact Sheet for Patients: EntrepreneurPulse.com.au  Fact Sheet for Healthcare Providers: IncredibleEmployment.be  This test is not yet approved or cleared by the [place] FDA and  has been authorized for detection and/or diagnosis of SARS-CoV-2 by FDA under an Emergency Use Authorization (EUA).  This EUA will remain in effect (meaning this test can be used) for the duration of  the COVID-19 declaration under Section 564(b)(1) of the A ct, 21 U.S.C. section 360bbb-3(b)(1), unless the authorization is terminated or revoked sooner.     Influenza A by PCR NEGATIVE NEGATIVE Final   Influenza B by PCR NEGATIVE NEGATIVE Final     Comment: (NOTE) The Xpert Xpress SARS-CoV-2/FLU/RSV plus assay is intended as an aid in the diagnosis of influenza from Nasopharyngeal swab specimens and should not be used as a sole basis for treatment. Nasal washings and aspirates are unacceptable for Xpert Xpress SARS-CoV-2/FLU/RSV testing.  Fact Sheet for Patients: EntrepreneurPulse.com.au  Fact Sheet for Healthcare Providers: IncredibleEmployment.be  This test is not yet approved or cleared by the [place] FDA and has been authorized for detection and/or diagnosis of SARS-CoV-2 by FDA under an Emergency Use Authorization (EUA). This EUA will remain in effect (meaning this test can be used) for the duration of the COVID-19 declaration under Section 564(b)(1) of the Act, 21 U.S.C. section 360bbb-3(b)(1), unless the authorization is terminated or revoked.     Resp Syncytial Virus by PCR NEGATIVE NEGATIVE Final    Comment: (NOTE) Fact Sheet for Patients: EntrepreneurPulse.com.au  Fact Sheet for Healthcare Providers: IncredibleEmployment.be  This test is not yet approved or cleared by the [place] FDA and has been authorized for detection and/or diagnosis of SARS-CoV-2 by  FDA under an Emergency Use Authorization (EUA). This EUA will remain in effect (meaning this test can be used) for the duration of the COVID-19 declaration under Section 564(b)(1) of the Act, 21 U.S.C. section 360bbb-3(b)(1), unless the authorization is terminated or revoked.  Performed at [facility], [place] [address], [place], [place] [address]     Labs: CBC: Recent Labs  Lab 11/15/22 [phone] 11/15/22 0951 11/15/22 1620 11/15/22 2221 11/16/22 0432  WBC 6.2  --   --   --  4.4  NEUTROABS 4.9  --   --   --   --   HGB 11.8* 11.4* 10.9* 11.0* 10.8*  HCT 34.8* 32.8* 32.3* 31.4* 32.1*  MCV 90.4  --   --   --  90.2  PLT 157  --   --   --  094   Basic Metabolic  Panel: Recent Labs  Lab 11/15/22 0409 11/15/22 0523 11/16/22 0432  NA 140 138 139  K 3.9 3.5 3.6  CL 104 107 104  CO2  --  22 25  GLUCOSE 118* 106* 101*  BUN '17 17 9  '$ CREATININE 1.20 1.04 1.08  CALCIUM  --  7.9* 8.5*  MG  --   --  1.8   Liver Function Tests: Recent Labs  Lab 11/15/22 0523  AST 15  ALT 18  ALKPHOS 40  BILITOT 0.6  PROT 5.2*  ALBUMIN 2.9*   CBG: No results for input(s): "GLUCAP" in the last 168 hours.  Discharge time spent: greater than 30 minutes.  Signed: Tawni Millers, MD [facility] 11/16/2022

## 2022-11-16 NOTE — Assessment & Plan Note (Addendum)
No clinical signs of acute withdrawal. Continue neuro checks per unit protocol.  As needed lorazepam.

## 2022-11-16 NOTE — Op Note (Signed)
[facility] Patient Name: Tony Buck Procedure Date : 11/16/2022 MRN: [id number] Attending MD: Gerrit Heck , MD, [id number] Date of Birth: [birth year] CSN: [id number] Age: 61 Admit Type: Inpatient Procedure:                Upper GI endoscopy Indications:              Acute post hemorrhagic anemia, Hematochezia Providers:                Gerrit Heck, MD, Doristine Johns, RN, Benetta Spar, Technician Referring MD:              Medicines:                Monitored Anesthesia Care Complications:            No immediate complications. Estimated Blood Loss:     Estimated blood loss: none. Procedure:                Pre-Anesthesia Assessment:                           - Prior to the procedure, a History and Physical                            was performed, and patient medications and                            allergies were reviewed. The patient's tolerance of                            previous anesthesia was also reviewed. The risks                            and benefits of the procedure and the sedation                            options and risks were discussed with the patient.                            All questions were answered, and informed consent                            was obtained. Prior Anticoagulants: The patient has                            taken no anticoagulant or antiplatelet agents. ASA                            Grade Assessment: III - A patient with severe                            systemic disease. After reviewing the risks and  benefits, the patient was deemed in satisfactory                            condition to undergo the procedure.                           After obtaining informed consent, the endoscope was                            passed under direct vision. Throughout the                            procedure, the patient's blood pressure, pulse, and                             oxygen saturations were monitored continuously. The                            [id number]-[id number] ([id number]) Olympus endoscope was introduced                            through the mouth, and advanced to the third part                            of duodenum. The upper GI endoscopy was                            accomplished without difficulty. The patient                            tolerated the procedure well. Scope In: Scope Out: Findings:      LA Grade A (one or more mucosal breaks less than 5 mm, not extending       between tops of 2 mucosal folds) esophagitis with no bleeding was found       in the lower third of the esophagus.      The entire examined stomach was normal.      The examined duodenum was normal. Impression:               - LA Grade A reflux esophagitis with no bleeding.                           - Normal stomach.                           - Normal examined duodenum.                           - No specimens collected. Recommendation:           - Perform a colonoscopy today.                           - See colonoscopy report for additional  recommendations. Procedure Code(s):        --- Professional ---                           [phone], Esophagogastroduodenoscopy, flexible,                            transoral; diagnostic, including collection of                            specimen(s) by brushing or washing, when performed                            (separate procedure) Diagnosis Code(s):        --- Professional ---                           K21.00, Gastro-esophageal reflux disease with                            esophagitis, without bleeding                           D62, Acute posthemorrhagic anemia                           K92.1, Melena (includes Hematochezia) CPT copyright 2022 American Medical Association. All rights reserved. The codes documented in this report are preliminary and upon coder review may  be revised to meet current  compliance requirements. Gerrit Heck, MD 11/16/2022 4:20:04 PM Number of Addenda: 0

## 2022-11-16 NOTE — Anesthesia Postprocedure Evaluation (Signed)
Anesthesia Post Note  Patient: Tony Buck  Procedure(s) Performed: ESOPHAGOGASTRODUODENOSCOPY (EGD) WITH PROPOFOL COLONOSCOPY     Patient location during evaluation: Endoscopy Anesthesia Type: General Level of consciousness: awake and alert Pain management: pain level controlled Vital Signs Assessment: post-procedure vital signs reviewed and stable Respiratory status: spontaneous breathing, nonlabored ventilation, respiratory function stable and patient connected to nasal cannula oxygen Cardiovascular status: blood pressure returned to baseline and stable Postop Assessment: no apparent nausea or vomiting Anesthetic complications: no  No notable events documented.  Last Vitals:  Vitals:   11/16/22 1625 11/16/22 1630  BP: 117/70 128/84  Pulse: 63 64  Resp: 16 18  Temp:    SpO2: 100% 100%    Last Pain:  Vitals:   11/16/22 1630  TempSrc:   PainSc: 0-No pain                 Saramarie Stinger DANIEL

## 2022-11-16 NOTE — Transfer of Care (Signed)
Immediate Anesthesia Transfer of Care Note  Patient: Tony Buck  Procedure(s) Performed: ESOPHAGOGASTRODUODENOSCOPY (EGD) WITH PROPOFOL COLONOSCOPY  Patient Location: PACU  Anesthesia Type:MAC  Level of Consciousness: drowsy  Airway & Oxygen Therapy: Patient Spontanous Breathing and Patient connected to nasal cannula oxygen  Post-op Assessment: Report given to RN and Post -op Vital signs reviewed and stable  Post vital signs: Reviewed and stable  Last Vitals:  Vitals Value Taken Time  BP    Temp    Pulse    Resp    SpO2      Last Pain:  Vitals:   11/16/22 1539  TempSrc: Temporal  PainSc: 0-No pain         Complications: No notable events documented.

## 2022-11-18 ENCOUNTER — Encounter (HOSPITAL_COMMUNITY): Payer: Self-pay | Admitting: Gastroenterology

## 2022-11-21 ENCOUNTER — Other Ambulatory Visit: Payer: 59

## 2023-01-27 ENCOUNTER — Ambulatory Visit
Admission: RE | Admit: 2023-01-27 | Discharge: 2023-01-27 | Disposition: A | Discharge status: Home / Self Care | Payer: 59 | Source: Ambulatory Visit | Attending: Family Medicine | Admitting: Family Medicine

## 2023-01-27 DIAGNOSIS — Z8249 Family history of ischemic heart disease and other diseases of the circulatory system: Secondary | ICD-10-CM
